# Patient Record
Sex: Male | Born: 1960
Health system: Southern US, Community
[De-identification: ages and names within clinical notes are randomized; demographics above are authoritative.]

## PROBLEM LIST (undated history)

## (undated) DIAGNOSIS — K5090 Crohn's disease, unspecified, without complications: Secondary | ICD-10-CM

## (undated) HISTORY — PX: OTHER SURGICAL HISTORY: SHX169

---

## 2017-03-14 DIAGNOSIS — K5000 Crohn's disease of small intestine without complications: Secondary | ICD-10-CM | POA: Diagnosis not present

## 2017-06-29 DIAGNOSIS — L298 Other pruritus: Secondary | ICD-10-CM | POA: Diagnosis not present

## 2017-06-29 DIAGNOSIS — M5412 Radiculopathy, cervical region: Secondary | ICD-10-CM | POA: Diagnosis not present

## 2017-06-30 DIAGNOSIS — M542 Cervicalgia: Secondary | ICD-10-CM | POA: Diagnosis not present

## 2017-08-16 DIAGNOSIS — Z23 Encounter for immunization: Secondary | ICD-10-CM | POA: Diagnosis not present

## 2017-09-06 DIAGNOSIS — Z7689 Persons encountering health services in other specified circumstances: Secondary | ICD-10-CM | POA: Diagnosis not present

## 2017-09-06 DIAGNOSIS — D649 Anemia, unspecified: Secondary | ICD-10-CM | POA: Diagnosis not present

## 2017-09-06 DIAGNOSIS — Z0000 Encounter for general adult medical examination without abnormal findings: Secondary | ICD-10-CM | POA: Diagnosis not present

## 2018-03-22 DIAGNOSIS — K5000 Crohn's disease of small intestine without complications: Secondary | ICD-10-CM | POA: Diagnosis not present

## 2018-06-15 DIAGNOSIS — Z23 Encounter for immunization: Secondary | ICD-10-CM | POA: Diagnosis not present

## 2018-06-15 DIAGNOSIS — M5412 Radiculopathy, cervical region: Secondary | ICD-10-CM | POA: Diagnosis not present

## 2018-06-15 DIAGNOSIS — S40862A Insect bite (nonvenomous) of left upper arm, initial encounter: Secondary | ICD-10-CM | POA: Diagnosis not present

## 2018-06-15 DIAGNOSIS — L089 Local infection of the skin and subcutaneous tissue, unspecified: Secondary | ICD-10-CM | POA: Diagnosis not present

## 2018-06-27 ENCOUNTER — Other Ambulatory Visit: Payer: Self-pay

## 2018-06-27 ENCOUNTER — Emergency Department: Payer: 59

## 2018-06-27 ENCOUNTER — Encounter: Payer: Self-pay | Admitting: Emergency Medicine

## 2018-06-27 ENCOUNTER — Inpatient Hospital Stay
Admission: EM | Admit: 2018-06-27 | Discharge: 2018-06-29 | DRG: 281 | Disposition: A | Discharge status: Home / Self Care | Payer: 59 | Attending: Internal Medicine | Admitting: Internal Medicine

## 2018-06-27 ENCOUNTER — Inpatient Hospital Stay (HOSPITAL_COMMUNITY)
Admit: 2018-06-27 | Discharge: 2018-06-27 | Disposition: A | Discharge status: Home / Self Care | Payer: 59 | Attending: Internal Medicine | Admitting: Internal Medicine

## 2018-06-27 DIAGNOSIS — I214 Non-ST elevation (NSTEMI) myocardial infarction: Secondary | ICD-10-CM | POA: Diagnosis not present

## 2018-06-27 DIAGNOSIS — N179 Acute kidney failure, unspecified: Secondary | ICD-10-CM | POA: Diagnosis present

## 2018-06-27 DIAGNOSIS — I248 Other forms of acute ischemic heart disease: Secondary | ICD-10-CM

## 2018-06-27 DIAGNOSIS — R5381 Other malaise: Secondary | ICD-10-CM | POA: Diagnosis not present

## 2018-06-27 DIAGNOSIS — Z9049 Acquired absence of other specified parts of digestive tract: Secondary | ICD-10-CM | POA: Diagnosis not present

## 2018-06-27 DIAGNOSIS — D649 Anemia, unspecified: Secondary | ICD-10-CM | POA: Diagnosis present

## 2018-06-27 DIAGNOSIS — R740 Nonspecific elevation of levels of transaminase and lactic acid dehydrogenase [LDH]: Secondary | ICD-10-CM | POA: Diagnosis present

## 2018-06-27 DIAGNOSIS — E860 Dehydration: Secondary | ICD-10-CM | POA: Diagnosis present

## 2018-06-27 DIAGNOSIS — D696 Thrombocytopenia, unspecified: Secondary | ICD-10-CM | POA: Diagnosis present

## 2018-06-27 DIAGNOSIS — F17210 Nicotine dependence, cigarettes, uncomplicated: Secondary | ICD-10-CM | POA: Diagnosis present

## 2018-06-27 DIAGNOSIS — B349 Viral infection, unspecified: Secondary | ICD-10-CM | POA: Diagnosis not present

## 2018-06-27 DIAGNOSIS — R0602 Shortness of breath: Secondary | ICD-10-CM | POA: Diagnosis not present

## 2018-06-27 DIAGNOSIS — Z79899 Other long term (current) drug therapy: Secondary | ICD-10-CM | POA: Diagnosis not present

## 2018-06-27 DIAGNOSIS — F419 Anxiety disorder, unspecified: Secondary | ICD-10-CM | POA: Diagnosis present

## 2018-06-27 DIAGNOSIS — I429 Cardiomyopathy, unspecified: Secondary | ICD-10-CM | POA: Diagnosis present

## 2018-06-27 DIAGNOSIS — I340 Nonrheumatic mitral (valve) insufficiency: Secondary | ICD-10-CM

## 2018-06-27 DIAGNOSIS — E876 Hypokalemia: Secondary | ICD-10-CM | POA: Diagnosis present

## 2018-06-27 DIAGNOSIS — D72829 Elevated white blood cell count, unspecified: Secondary | ICD-10-CM | POA: Diagnosis present

## 2018-06-27 DIAGNOSIS — E871 Hypo-osmolality and hyponatremia: Secondary | ICD-10-CM

## 2018-06-27 DIAGNOSIS — R748 Abnormal levels of other serum enzymes: Secondary | ICD-10-CM | POA: Diagnosis not present

## 2018-06-27 DIAGNOSIS — R7989 Other specified abnormal findings of blood chemistry: Secondary | ICD-10-CM | POA: Diagnosis present

## 2018-06-27 DIAGNOSIS — R778 Other specified abnormalities of plasma proteins: Secondary | ICD-10-CM | POA: Diagnosis present

## 2018-06-27 DIAGNOSIS — I420 Dilated cardiomyopathy: Secondary | ICD-10-CM | POA: Diagnosis not present

## 2018-06-27 HISTORY — DX: Crohn's disease, unspecified, without complications: K50.90

## 2018-06-27 LAB — URINALYSIS, COMPLETE (UACMP) WITH MICROSCOPIC
Bilirubin Urine: NEGATIVE
GLUCOSE, UA: NEGATIVE mg/dL
Ketones, ur: 5 mg/dL — AB
Nitrite: NEGATIVE
PROTEIN: 30 mg/dL — AB
SQUAMOUS EPITHELIAL / LPF: NONE SEEN (ref 0–5)
Specific Gravity, Urine: 1.023 (ref 1.005–1.030)
WBC, UA: >50 WBC/hpf — ABNORMAL HIGH (ref 0–5)
pH: 5 (ref 5.0–8.0)

## 2018-06-27 LAB — LIPID PANEL
Cholesterol: 104 mg/dL (ref 0–200)
HDL: <10 mg/dL — ABNORMAL LOW
Triglycerides: 193 mg/dL — ABNORMAL HIGH
VLDL: 39 mg/dL (ref 0–40)

## 2018-06-27 LAB — COMPREHENSIVE METABOLIC PANEL
ALBUMIN: 3.1 g/dL — AB (ref 3.5–5.0)
ALT: 115 U/L — ABNORMAL HIGH (ref 0–44)
AST: 158 U/L — AB (ref 15–41)
Alkaline Phosphatase: 124 U/L (ref 38–126)
Anion gap: 14 (ref 5–15)
BUN: 48 mg/dL — AB (ref 6–20)
CHLORIDE: 96 mmol/L — AB (ref 98–111)
CO2: 21 mmol/L — AB (ref 22–32)
CREATININE: 3.35 mg/dL — AB (ref 0.61–1.24)
Calcium: 8.5 mg/dL — ABNORMAL LOW (ref 8.9–10.3)
GFR calc Af Amer: 22 mL/min — ABNORMAL LOW (ref >60)
GFR calc non Af Amer: 19 mL/min — ABNORMAL LOW (ref >60)
GLUCOSE: 126 mg/dL — AB (ref 70–99)
Potassium: 3.5 mmol/L (ref 3.5–5.1)
SODIUM: 131 mmol/L — AB (ref 135–145)
Total Bilirubin: 1.5 mg/dL — ABNORMAL HIGH (ref 0.3–1.2)
Total Protein: 7.4 g/dL (ref 6.5–8.1)

## 2018-06-27 LAB — TROPONIN I
Troponin I: 3.9 ng/mL (ref <0.03)
Troponin I: 2.84 ng/mL
Troponin I: 2.58 ng/mL

## 2018-06-27 LAB — CBC
HCT: 43 % (ref 40.0–52.0)
Hemoglobin: 14.7 g/dL (ref 13.0–18.0)
MCH: 30.3 pg (ref 26.0–34.0)
MCHC: 34.2 g/dL (ref 32.0–36.0)
MCV: 88.8 fL (ref 80.0–100.0)
PLATELETS: 155 10*3/uL (ref 150–440)
RBC: 4.84 MIL/uL (ref 4.40–5.90)
RDW: 14.6 % — AB (ref 11.5–14.5)
WBC: 16.8 10*3/uL — AB (ref 3.8–10.6)

## 2018-06-27 LAB — HEMOGLOBIN A1C
Hgb A1c MFr Bld: 5.5 % (ref 4.8–5.6)
MEAN PLASMA GLUCOSE: 111.15 mg/dL

## 2018-06-27 LAB — PROTIME-INR
INR: 1.16
PROTHROMBIN TIME: 14.7 s (ref 11.4–15.2)

## 2018-06-27 LAB — GLUCOSE, CAPILLARY: Glucose-Capillary: 129 mg/dL — ABNORMAL HIGH (ref 70–99)

## 2018-06-27 LAB — HEPARIN LEVEL (UNFRACTIONATED): Heparin Unfractionated: 0.24 IU/mL — ABNORMAL LOW (ref 0.30–0.70)

## 2018-06-27 LAB — APTT: aPTT: 31 seconds (ref 24–36)

## 2018-06-27 MED ORDER — ACETAMINOPHEN 650 MG RE SUPP: 650.0000 mg | Freq: Four times a day (QID) | RECTAL | Status: DC | PRN

## 2018-06-27 MED ORDER — OXYCODONE HCL 5 MG PO TABS: 5.0000 mg | ORAL_TABLET | ORAL | Status: DC | PRN

## 2018-06-27 MED ORDER — HEPARIN BOLUS VIA INFUSION
4000.0000 [IU] | Freq: Once | INTRAVENOUS | Status: AC
  Administered 2018-06-27: 4000 [IU] via INTRAVENOUS
  Filled 2018-06-27: qty 4000

## 2018-06-27 MED ORDER — ONDANSETRON HCL 4 MG/2ML IJ SOLN: 4.0000 mg | Freq: Four times a day (QID) | INTRAMUSCULAR | Status: DC | PRN

## 2018-06-27 MED ORDER — ASPIRIN 81 MG PO CHEW
81.0000 mg | CHEWABLE_TABLET | Freq: Every day | ORAL | Status: DC
  Administered 2018-06-27 – 2018-06-29 (×3): 81 mg via ORAL
  Filled 2018-06-27 (×3): qty 1

## 2018-06-27 MED ORDER — SENNOSIDES-DOCUSATE SODIUM 8.6-50 MG PO TABS: 1.0000 | ORAL_TABLET | Freq: Every evening | ORAL | Status: DC | PRN

## 2018-06-27 MED ORDER — SODIUM CHLORIDE 0.9 % IV SOLN
INTRAVENOUS | Status: DC
  Administered 2018-06-27 – 2018-06-29 (×5): via INTRAVENOUS

## 2018-06-27 MED ORDER — ONDANSETRON HCL 4 MG PO TABS: 4.0000 mg | ORAL_TABLET | Freq: Four times a day (QID) | ORAL | Status: DC | PRN

## 2018-06-27 MED ORDER — SODIUM CHLORIDE 0.9 % IV BOLUS
1000.0000 mL | Freq: Once | INTRAVENOUS | Status: AC
  Administered 2018-06-27: 1000 mL via INTRAVENOUS

## 2018-06-27 MED ORDER — HEPARIN (PORCINE) IN NACL 100-0.45 UNIT/ML-% IJ SOLN
1700.0000 [IU]/h | INTRAMUSCULAR | Status: DC
  Administered 2018-06-27: 1150 [IU]/h via INTRAVENOUS
  Administered 2018-06-27: 1000 [IU]/h via INTRAVENOUS
  Administered 2018-06-28: 1500 [IU]/h via INTRAVENOUS
  Administered 2018-06-28: 1300 [IU]/h via INTRAVENOUS
  Filled 2018-06-27 (×3): qty 250

## 2018-06-27 MED ORDER — HEPARIN BOLUS VIA INFUSION
1250.0000 [IU] | Freq: Once | INTRAVENOUS | Status: AC
  Administered 2018-06-27: 1250 [IU] via INTRAVENOUS
  Filled 2018-06-27: qty 1250

## 2018-06-27 MED ORDER — ACETAMINOPHEN 325 MG PO TABS
650.0000 mg | ORAL_TABLET | Freq: Four times a day (QID) | ORAL | Status: DC | PRN
  Administered 2018-06-27: 650 mg via ORAL
  Filled 2018-06-27: qty 2

## 2018-06-27 MED ORDER — ALPRAZOLAM 0.5 MG PO TABS
0.2500 mg | ORAL_TABLET | Freq: Three times a day (TID) | ORAL | Status: DC | PRN
  Administered 2018-06-27 – 2018-06-28 (×2): 0.25 mg via ORAL
  Filled 2018-06-27 (×2): qty 1

## 2018-06-27 NOTE — Progress Notes (Signed)
ANTICOAGULATION CONSULT NOTE - Initial Consult  Pharmacy Consult for Heparin Indication: chest pain/ACS  No Known Allergies  Patient Measurements: Height: 5' 10"  (177.8 cm) Weight: 177 lb 6.4 oz (80.5 kg) IBW/kg (Calculated) : 73 Heparin Dosing Weight: 83.9 kg  Vital Signs: Temp: 98.2 F (36.8 C) (08/28 1649) Temp Source: Oral (08/28 1649) BP: 105/78 (08/28 1649) Pulse Rate: 106 (08/28 1649)  Labs: Recent Labs    06/27/18 1038 06/27/18 1217 06/27/18 1358 06/27/18 1841  HGB 14.7  --   --   --   HCT 43.0  --   --   --   PLT 155  --   --   --   APTT  --  31  --   --   LABPROT  --  14.7  --   --   INR  --  1.16  --   --   HEPARINUNFRC  --   --   --  0.24*  CREATININE 3.35*  --   --   --   TROPONINI 3.90*  --  2.84* 2.58*    Estimated Creatinine Clearance: 25.4 mL/min (A) (by C-G formula based on SCr of 3.35 mg/dL (H)).   Medical History: Past Medical History:  Diagnosis Date  . Crohn disease ([place])     Medications:  Infusions:  . sodium chloride 125 mL/hr at 06/27/18 1439  . heparin 1,000 Units/hr (06/27/18 1239)    Assessment: 57 yo male with shortness of breath, weakness, dizziness of 3 days.  Elevated troponin of 3.9 consistent with NSTEMI.  HEPARIN COURSE Give 4000 units bolus x 1 Start heparin infusion at 1000 units/hr Check anti-Xa level in 6 hours and daily while on heparin Continue to monitor H&H and platelets  Goal of Therapy:  Heparin level 0.3-0.7 units/ml Monitor platelets by anticoagulation protocol: Yes   Plan:  06/27/18 18:41 HL subtherapeutic x 1. 1250 units IV x 1 bolus and increase rate to 1150 units/hr. Will recheck HL 6 hours after rate increase.   Amana Bouska A. [place], [place].D., BCPS Clinical Pharmacist 06/27/2018,8:08 PM

## 2018-06-27 NOTE — ED Provider Notes (Signed)
[facility] Emergency Department Provider Note  Time seen: 10:26 AM  I have reviewed the triage vital signs and the nursing notes.   HISTORY  Chief Complaint Shortness of Breath; Weakness; Dizziness; Hip Pain; and Fall    HPI Justin Fernandez is a 57 y.o. male with a remote past medical history of Crohn's disease status post partial bowel resection, presents to the emergency department with complaints of nausea vomiting, diarrhea and chills and generalized fatigue/weakness.  According to the patient proximal he 5 days ago he started with nausea vomiting and diarrhea states those symptoms have largely resolved but he has been having chills over the past few days and will occasionally become sweaty.  States he is feeling generalized fatigue and weakness.  Denies any known fever.  Patient is feeling more dizzy today.  Patient did fall on his right hip 2 days ago while trying to get to the bathroom to vomit.  States mild pain in the right hip.   History reviewed. No pertinent past medical history.  There are no active problems to display for this patient.   History reviewed. No pertinent surgical history.  Prior to Admission medications   Not on File    Not on File  No family history on file.  Social History Social History   Tobacco Use  . Smoking status: Not on file  Substance Use Topics  . Alcohol use: Not on file  . Drug use: Not on file   Review of Systems Constitutional: Negative for fever.  Positive for dizziness and generalized weakness. Eyes: Negative for visual complaints ENT: Negative for recent illness/congestion Cardiovascular: Negative for chest pain. Respiratory: Negative for shortness of breath. Gastrointestinal: Negative for abdominal pain.  Positive for nausea vomiting and diarrhea which has since resolved Genitourinary: Negative for urinary compaints Musculoskeletal: Negative for musculoskeletal complaints Skin: Negative for skin  complaints  Neurological: Negative for headache All other ROS negative  ____________________________________________   PHYSICAL EXAM:  VITAL SIGNS: ED Triage Vitals  Enc Vitals Group     BP 06/27/18 0949 (!) 144/128     Pulse Rate 06/27/18 0949 (!) 117     Resp 06/27/18 0949 20     Temp 06/27/18 0950 98 F (36.7 C)     Temp Source 06/27/18 0950 Oral     SpO2 06/27/18 0949 97 %     Weight 06/27/18 0949 185 lb (83.9 kg)     Height 06/27/18 0949 5' 10"  (1.778 m)     Head Circumference --      Peak Flow --      Pain Score 06/27/18 0948 5     Pain Loc --      Pain Edu? --      Excl. in [place]? --     Constitutional: Alert and oriented. Well appearing and in no distress. Eyes: Normal exam ENT   Head: Normocephalic and atraumatic.   Mouth/Throat: Mucous membranes are moist. Cardiovascular: Normal rate, regular rhythm. No murmur Respiratory: Normal respiratory effort without tachypnea nor retractions. Breath sounds are clear Gastrointestinal: Soft and nontender. No distention.   Musculoskeletal: Mild right hip tenderness to palpation great range of motion, no concern for fracture dislocation. Neurologic:  Normal speech and language. No gross focal neurologic deficits Skin:  Skin is warm, dry and intact.  Psychiatric: Mood and affect are normal.  ____________________________________________    EKG  EKG reviewed and interpreted by myself shows sinus tachycardia 117 bpm with a narrow QRS, normal axis, normal intervals,  nonspecific ST changes without ST elevation.  ____________________________________________    RADIOLOGY  Chest x-ray negative  ____________________________________________   INITIAL IMPRESSION / ASSESSMENT AND PLAN / ED COURSE  Pertinent labs & imaging results that were available during my care of the patient were reviewed by me and considered in my medical decision making (see chart for details).  Patient presents to the emergency department for  dizziness and weakness, states to 3 days ago he was having nausea vomiting diarrhea which has since resolved.  Differential would include gastroenteritis, renal insufficiency, infectious etiology such as urinary tract infection or pneumonia.  We will check labs, chest x-ray, IV hydrate and closely monitor in the emergency department.  Patient's labs have resulted very abnormal with acute renal failure elevated LFTs and a troponin of 3.9 consistent with NSTEMI possible shock liver.  Patient is somewhat hypotensive receiving IV fluids.  We will start on a heparin infusion and admit to the hospitalist service.  CRITICAL CARE Performed by: Harvest Dark   Total critical care time: 30 minutes  Critical care time was exclusive of separately billable procedures and treating other patients.  Critical care was necessary to treat or prevent imminent or life-threatening deterioration.  Critical care was time spent personally by me on the following activities: development of treatment plan with patient and/or surrogate as well as nursing, discussions with consultants, evaluation of patient's response to treatment, examination of patient, obtaining history from patient or surrogate, ordering and performing treatments and interventions, ordering and review of laboratory studies, ordering and review of radiographic studies, pulse oximetry and re-evaluation of patient's condition.  ____________________________________________   FINAL CLINICAL IMPRESSION(S) / ED DIAGNOSES  Weakness NSTEMI    Harvest Dark, MD 06/27/18 1136

## 2018-06-27 NOTE — Plan of Care (Signed)
  Problem: Clinical Measurements: Goal: Ability to maintain clinical measurements within normal limits will improve Outcome: Progressing Note:  Troponin values trending down from 3.9 to 2.8. Will continue to monitor lab values. Wenda Low [facility]

## 2018-06-27 NOTE — ED Triage Notes (Signed)
Pt reports was dizzy and fell Monday hurting his right hip. Pt reports has had flu like sx's for a week. Pt reports feels dizzy, weak, like he can't breathe and not well. Pt pale in triage.

## 2018-06-27 NOTE — H&P (Signed)
[facility] NAME: Justin Fernandez    MR#:  [id number]  DATE OF BIRTH:  April 13, 1961  DATE OF ADMISSION:  06/27/2018  PRIMARY CARE PHYSICIAN: Dr Kary Kos   REQUESTING/REFERRING PHYSICIAN: Dr Kerman Passey  CHIEF COMPLAINT:   weakness HISTORY OF PRESENT ILLNESS:  Justin Fernandez  is a 57 y.o. male with a known history of Crohn's disease status post partial bowel resection over 30 years ago and tobacco dependence who presents to the emergency room due to generalized weakness.  Patient reports over the past several days he has had nausea, vomiting and diarrhea however the symptoms have improved.  He has been very weak and so comes to the ER for further evaluation.  In the emergency room he is noted to have acute kidney injury and elevated troponin.  Patient denies chest pain, shortness of breath, fever or chills.  Patient denies any urinary symptoms however he has had decreased urine output over the past 24 hours.  Patient denies abdominal pain and has mentioned above nausea, vomiting and diarrhea have improved.  Patient does report feelings of dizziness with lightheadedness however denies syncope.  PAST MEDICAL HISTORY:  Crohn's  disease  PAST SURGICAL HISTORY:  Partial bowel resection for Crohn's  SOCIAL HISTORY:   Patient smokes 1 pack a day and drinks occasional EtOH  FAMILY HISTORY:  No history of Crohn's or CAD  DRUG ALLERGIES:  No Known Allergies  REVIEW OF SYSTEMS:   Review of Systems  Constitutional: Positive for malaise/fatigue. Negative for chills and fever.  HENT: Negative.  Negative for ear discharge, ear pain, hearing loss, nosebleeds and sore throat.   Eyes: Negative.  Negative for blurred vision and pain.  Respiratory: Negative.  Negative for cough, hemoptysis, shortness of breath and wheezing.   Cardiovascular: Negative.  Negative for chest pain, palpitations and leg swelling.  Gastrointestinal: Negative.  Negative for  abdominal pain, blood in stool, diarrhea, nausea and vomiting.  Genitourinary: Negative for dysuria.       Decreased urine output  Musculoskeletal: Negative.  Negative for back pain.  Skin: Negative.   Neurological: Negative for dizziness, tremors, speech change, focal weakness, seizures and headaches.  Endo/Heme/Allergies: Negative.  Does not bruise/bleed easily.  Psychiatric/Behavioral: Negative.  Negative for depression, hallucinations and suicidal ideas.    MEDICATIONS AT HOME:   Prior to Admission medications   Medication Sig Start Date End Date Taking? Authorizing Provider  ALPRAZolam Duanne Moron) 0.5 MG tablet Take 0.5 mg by mouth 3 (three) times daily. 06/14/18  Yes [provider]  cyanocobalamin (,VITAMIN B-12,) 1000 MCG/ML injection Inject 1,000 mcg into the skin every 30 (thirty) days. 06/05/18  Yes [provider]  Multiple Vitamin (MULTI-VITAMINS) TABS Take 1 tablet by mouth daily.   Yes [provider]  vitamin C (ASCORBIC ACID) 500 MG tablet Take 500 mg by mouth daily.   Yes [provider]      VITAL SIGNS:  Blood pressure 92/75, pulse (!) 102, temperature 98 F (36.7 C), temperature source Oral, resp. rate 16, height 5' 10"  (1.778 m), weight 83.9 kg, SpO2 100 %.  PHYSICAL EXAMINATION:   Physical Exam  Constitutional: He is oriented to person, place, and time. No distress.  HENT:  Head: Normocephalic.  Eyes: No scleral icterus.  Neck: Normal range of motion. Neck supple. No JVD present. No tracheal deviation present.  Cardiovascular: Regular rhythm and normal heart sounds. Exam reveals no gallop and no friction rub.  No murmur heard. Tachycardic  Pulmonary/Chest: Effort normal and breath sounds normal. No respiratory distress. He has no wheezes. He has no rales. He exhibits no tenderness.  Abdominal: Soft. Bowel sounds are normal. He exhibits no distension and no mass. There is no tenderness. There is no rebound and no guarding.   Musculoskeletal: Normal range of motion. He exhibits no edema.  Neurological: He is alert and oriented to person, place, and time.  Skin: Skin is warm. No rash noted. No erythema.  Psychiatric: Judgment normal.      LABORATORY PANEL:   CBC Recent Labs  Lab 06/27/18 1038  WBC 16.8*  HGB 14.7  HCT 43.0  PLT 155   ------------------------------------------------------------------------------------------------------------------  Chemistries  Recent Labs  Lab 06/27/18 1038  NA 131*  K 3.5  CL 96*  CO2 21*  GLUCOSE 126*  BUN 48*  CREATININE 3.35*  CALCIUM 8.5*  AST 158*  ALT 115*  ALKPHOS 124  BILITOT 1.5*   ------------------------------------------------------------------------------------------------------------------  Cardiac Enzymes Recent Labs  Lab 06/27/18 1038  TROPONINI 3.90*   ------------------------------------------------------------------------------------------------------------------  RADIOLOGY:  Dg Chest 2 View  Result Date: 06/27/2018 CLINICAL DATA:  Shortness of breath. EXAM: CHEST - 2 VIEW COMPARISON:  None. FINDINGS: The heart size and mediastinal contours are within normal limits. Both lungs are clear. No pneumothorax or pleural effusion is noted. The visualized skeletal structures are unremarkable. IMPRESSION: No active cardiopulmonary disease. Electronically Signed   By: Marijo Conception, M.D.   On: 06/27/2018 10:34    EKG:  Sinus tachycardia no ST elevation or depression  IMPRESSION AND PLAN:   57 year old male with tobacco dependence and remote history of Crohn's disease who presents to the emergency room after 4 to 5 days of nausea, vomiting and diarrhea which have subsided however has generalized weakness.  1.  Elevated troponin of 3.90: This is concerning for non-ST elevation MI versus demand ischemia with poor renal clearance Continue heparin drip as started by ED physician.  Side effects, alternatives, risks and benefits were  discussed with patient who accepts these risks. No beta-blocker due to low blood pressure Start aspirin Check lipid panel and A1c Follow-up on echocardiogram C [facility] cardiology consultation requested Further management after cardiology evaluation  2.  Acute kidney injury due to nausea, vomiting and diarrhea with decreased urine output Continue aggressive hydration Avoid nephrotoxic medications Consider renal ultrasound and renal consultation if creatinine has not improved in a.m.  3.  Hyponatremia from poor p.o. intake and dehydration which should improve with IV fluids  4.Tobacco dependence: Patient is encouraged to quit smoking. Counseling was provided for 4 minutes. He does not want a nicotine patch at this time but reports he will speak with nurse if he needs one later.     All the records are reviewed and case discussed with ED provider. Management plans discussed with the patient and he is in agreement  CODE STATUS: Full  TOTAL TIME TAKING CARE OF THIS PATIENT: 48 minutes.    Austina Constantin M.D on 06/27/2018 at 12:02 PM  Between 7am to 6pm - Pager - [phone]  After 6pm go to [email or web address] - password [email or web address] [facility]  Office  [phone]  CC: Primary care physician; [place] Pink, MD

## 2018-06-27 NOTE — ED Notes (Signed)
Pt's neighbor states pt has his phone number if he needs anything, "he has no one else."

## 2018-06-27 NOTE — Progress Notes (Signed)
Family Meeting Note  Advance Directive:yes  Today a meeting took place with the Patient.  The following clinical team members were present during this meeting:MD  The following were discussed:Patient's diagnosis elevated troponin/non-ST elevation MI Acute kidney injury Hyponatremia: , Patient's progosis: > 12 months and Goals for treatment: Full Code  Additional follow-up to be provided: Patient is full code.  His healthcare power of attorney is a friend Hinda Lenis.  His advanced directives are up-to-date.  Time spent during discussion: 16 minutes  Dale Ribeiro, MD

## 2018-06-27 NOTE — ED Notes (Signed)
Report to [facility]

## 2018-06-27 NOTE — Progress Notes (Signed)
*  PRELIMINARY RESULTS* Echocardiogram 2D Echocardiogram has been performed.  Justin Fernandez Elke Holtry 06/27/2018, 8:54 PM

## 2018-06-27 NOTE — Progress Notes (Signed)
ANTICOAGULATION CONSULT NOTE - Initial Consult  Pharmacy Consult for Heparin Indication: chest pain/ACS  No Known Allergies  Patient Measurements: Height: 5' 10"  (177.8 cm) Weight: 185 lb (83.9 kg) IBW/kg (Calculated) : 73 Heparin Dosing Weight: 83.9 kg  Vital Signs: Temp: 98 F (36.7 C) (08/28 0950) Temp Source: Oral (08/28 0950) BP: 92/75 (08/28 1100) Pulse Rate: 102 (08/28 1100)  Labs: Recent Labs    06/27/18 1038  HGB 14.7  HCT 43.0  PLT 155  CREATININE 3.35*  TROPONINI 3.90*    Estimated Creatinine Clearance: 25.4 mL/min (A) (by C-G formula based on SCr of 3.35 mg/dL (H)).   Medical History: History reviewed. No pertinent past medical history.  Medications:  Infusions:  . sodium chloride      Assessment: 57 yo male with shortness of breath, weakness, dizziness of 3 days.  Elevated troponin of 3.9 consistent with NSTEMI.  Goal of Therapy:  Heparin level 0.3-0.7 units/ml Monitor platelets by anticoagulation protocol: Yes   Plan:  Give 4000 units bolus x 1 Start heparin infusion at 1000 units/hr Check anti-Xa level in 6 hours and daily while on heparin Continue to monitor H&H and platelets  Prudy Feeler, RPh 06/27/2018,12:02 PM

## 2018-06-27 NOTE — Consult Note (Signed)
Cardiology Consultation:   Patient ID: Justin Fernandez; [id number]; November 12, 1960   Admit date: 06/27/2018 Date of Consult: 06/27/2018  Primary Care Provider: Maryland Pink, MD Primary Cardiologist: new to [facility] - consult by [place]   Patient Profile:   Justin Fernandez is a 57 y.o. male with a hx of Crohn's disease s/p partial bowel resection in the remote past and tobacco abuse who is being seen today for the evaluation of elevated troponin at the request of Dr. Benjie Karvonen.  History of Present Illness:   Mr. Fandrich does not have any previously known cardiac history. 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 He recently underwent bone-grafting and dental extraction about 1 month prior. He has been in his usual state of health up until the week prior when he developed severe lethargy sleeping intermittently for 20 hours per day with frequent urination every 45 minutes followed by nausea, vomiting, and diarrhea with poor PO intake. He has not been able to eat anything or drink anything other than some electrolyte drink he had from [place]. In this setting, he suffered an mechanical fall in his bathroom secondary to weakness. He initially thought he had the flu, though his symptoms have continued to worsen. He has bene afebrile and without chills. No myalgias or rashes. Never with chest pain, SOB, or diaphoresis.   Upon the patient's arrival to [facility] they were found to have BP in the 197J systolic that has trended to the 88T to 25Q systolic, HR 982 bpm, temp 98, oxygen saturation 97% on room air, weight 83/9 kg. EKG as below, CXR showed no active cardiopulmonary disease. Labs showed troponin 3.90, SCr 3.35 (prior SCr 1.0 from 08/2017), BUN 48, Na 131, K+ 3.5, glucose 126, albumin 3.1, AST 158, ALT 115, T bili 1.5, WBC 16.8, HGB 14.7, PLT 155. In the ED and upon admission the patient was given IV fluids and started on a heparin gtt. Currently, continues to feel weak.    Past  Medical History:  Diagnosis Date  . Crohn disease [facility])     Past Surgical History:  Procedure Laterality Date  . bowel resection       Home Meds: Prior to Admission medications   Medication Sig Start Date End Date Taking? Authorizing Provider  ALPRAZolam Duanne Moron) 0.5 MG tablet Take 0.5 mg by mouth 3 (three) times daily. 06/14/18  Yes [provider]  cyanocobalamin (,VITAMIN B-12,) 1000 MCG/ML injection Inject 1,000 mcg into the skin every 30 (thirty) days. 06/05/18  Yes [provider]  loperamide (IMODIUM) 2 MG capsule Take 2-4 mg by mouth daily.   Yes [provider]  Multiple Vitamin (MULTI-VITAMINS) TABS Take 1 tablet by mouth daily.   Yes [provider]  vitamin C (ASCORBIC ACID) 500 MG tablet Take 500 mg by mouth daily.   Yes [provider]    Inpatient Medications: Scheduled Meds: . aspirin  81 mg Oral Daily   Continuous Infusions: . sodium chloride 125 mL/hr at 06/27/18 1439  . heparin 1,000 Units/hr (06/27/18 1239)   PRN Meds: acetaminophen **OR** acetaminophen, ALPRAZolam, ondansetron **OR** ondansetron (ZOFRAN) IV, oxyCODONE, senna-docusate  Allergies:  No Known Allergies  Social History:   Social History   Socioeconomic History  . Marital status: Divorced    Spouse name: Not on file  . Number of children: Not on file  . Years of education: Not on file  . Highest education level: Not on file  Occupational  History  . Not on file  Social Needs  . Financial resource strain: Not on file  . Food insecurity:    Worry: Not on file    Inability: Not on file  . Transportation needs:    Medical: Not on file    Non-medical: Not on file  Tobacco Use  . Smoking status: Not on file  Substance and Sexual Activity  . Alcohol use: Not on file  . Drug use: Not on file  . Sexual activity: Not on file  Lifestyle  . Physical activity:    Days per week: Not on file    Minutes per session: Not on file  . Stress: Not on file   Relationships  . Social connections:    Talks on phone: Not on file    Gets together: Not on file    Attends religious service: Not on file    Active member of club or organization: Not on file    Attends meetings of clubs or organizations: Not on file    Relationship status: Not on file  . Intimate partner violence:    Fear of current or ex partner: Not on file    Emotionally abused: Not on file    Physically abused: Not on file    Forced sexual activity: Not on file  Other Topics Concern  . Not on file  Social History Narrative  . Not on file     Family History:   Family History  Problem Relation Age of Onset  . Breast cancer Mother   . Colon cancer Father   . Stroke Maternal Grandmother   . Stroke Maternal Grandfather     ROS:  Review of Systems  Constitutional: Positive for malaise/fatigue and weight loss. Negative for chills, diaphoresis and fever.  HENT: Negative for congestion.   Eyes: Negative for discharge and redness.  Respiratory: Negative for cough, hemoptysis, sputum production, shortness of breath and wheezing.   Cardiovascular: Negative for chest pain, palpitations, orthopnea, claudication, leg swelling and PND.  Gastrointestinal: Positive for abdominal pain, diarrhea, nausea and vomiting. Negative for blood in stool, constipation, heartburn and melena.  Genitourinary: Negative for hematuria.  Musculoskeletal: Positive for falls. Negative for myalgias.  Skin: Negative for rash.  Neurological: Positive for weakness. Negative for dizziness, tingling, tremors, sensory change, speech change, focal weakness and loss of consciousness.  Endo/Heme/Allergies: Does not bruise/bleed easily.  Psychiatric/Behavioral: Negative for substance abuse. The patient is not nervous/anxious.   All other systems reviewed and are negative.     Physical Exam/Data:   Vitals:   06/27/18 1036 06/27/18 1100 06/27/18 1300 06/27/18 1649  BP: 97/73 92/75 95/70  105/78  Pulse: (!) 104  (!) 102 (!) 101 (!) 106  Resp: 18 16 19    Temp:   97.9 F (36.6 C) 98.2 F (36.8 C)  TempSrc:   Oral Oral  SpO2: 96% 100% 97% 96%  Weight:      Height:        Intake/Output Summary (Last 24 hours) at 06/27/2018 1708 Last data filed at 06/27/2018 1650 Gross per 24 hour  Intake -  Output 150 ml  Net -150 ml   Filed Weights   06/27/18 0949  Weight: 83.9 kg   Body mass index is 26.54 kg/m.   Physical Exam: General: Well developed, well nourished, in no acute distress. Head: Normocephalic, atraumatic, sclera non-icteric, no xanthomas, nares without discharge.  Neck: Negative for carotid bruits. JVD not elevated. Lungs: Clear bilaterally to auscultation without wheezes, rales, or  rhonchi. Breathing is unlabored. Heart: Tachycardic with S1 S2. No murmurs, rubs, or gallops appreciated. Abdomen: Soft, non-tender, non-distended with normoactive bowel sounds. No hepatomegaly. No rebound/guarding. No obvious abdominal masses. Msk:  Strength and tone appear normal for age. Extremities: No clubbing or cyanosis. No edema. Distal pedal pulses are 2+ and equal bilaterally. Neuro: Alert and oriented X 3. No facial asymmetry. No focal deficit. Moves all extremities spontaneously. Psych:  Responds to questions appropriately with a normal affect.   EKG:  The EKG was personally reviewed and demonstrates: sinus tachycardia, 117 bpm, poor R wave progression, nonspecific st/t changes  Telemetry:  Telemetry was personally reviewed and demonstrates: NSR  Weights: [employer]   06/27/18 0949  Weight: 83.9 kg    Relevant CV Studies: Echo pending  Laboratory Data:  Chemistry Recent Labs  Lab 06/27/18 1038  NA 131*  K 3.5  CL 96*  CO2 21*  GLUCOSE 126*  BUN 48*  CREATININE 3.35*  CALCIUM 8.5*  GFRNONAA 19*  GFRAA 22*  ANIONGAP 14    Recent Labs  Lab 06/27/18 1038  PROT 7.4  ALBUMIN 3.1*  AST 158*  ALT 115*  ALKPHOS 124  BILITOT 1.5*   Hematology Recent Labs  Lab  06/27/18 1038  WBC 16.8*  RBC 4.84  HGB 14.7  HCT 43.0  MCV 88.8  MCH 30.3  MCHC 34.2  RDW 14.6*  PLT 155   Cardiac Enzymes Recent Labs  Lab 06/27/18 1038 06/27/18 1358  TROPONINI 3.90* 2.84*   No results for input(s): TROPIPOC in the last 168 hours.  BNPNo results for input(s): BNP, PROBNP in the last 168 hours.  DDimer No results for input(s): DDIMER in the last 168 hours.  Radiology/Studies:  Dg Chest 2 View  Result Date: 06/27/2018 IMPRESSION: No active cardiopulmonary disease. Electronically Signed   By: Marijo Conception, M.D.   On: 06/27/2018 10:34    Assessment and Plan:   1. Elevated troponin: -Initial troponin is elevated at 3.9 -Subsequent troponin is down trending to 2.84 -Never with anginal symptoms  -Possibly in the setting of supply demand ischemia in the setting of the below -Continue heparin gtt until echo is read, if normal EF would stop heparin at that time -Not a current candidate for LHC given his AKI -He will need an ischemic evaluation, with the timing, and type of evaluation pending his renal function trend and echo results  -Check urine drug screen -Check echo -Check A1c and lipid panel for further risk stratification  -Consider PE evaluation, if IM feels indicated   2. ARF: -IV fluids per IM -Consider nephrology evaluation  -Likely contributing to the above  3. Transaminatitis: -Recommend further work up per IM -Recommend imaging per IM  4. Hyponatremia: -IV fluids per IM  5. Nausea/vomiting/diarrhea: -Per IM -Check magnesium   6. Leukocytosis: -Likely in the setting of the above -Cannot exclude unknown infection at this time -Recommend full work up with cultures per IM -His overall presentation is concerning for unknown process   For questions or updates, please contact [place] Please consult [email or web address] for contact info under Cardiology/STEMI.   Signed, Christell Faith, PA-C [place] Pager: [phone] 06/27/2018, 5:08 PM

## 2018-06-27 NOTE — ED Notes (Signed)
Pt reports sxs of weakness and dizziness since last week, pt states he fell on Monday night pt states he has had poor PO intake x a week.

## 2018-06-28 DIAGNOSIS — I420 Dilated cardiomyopathy: Secondary | ICD-10-CM

## 2018-06-28 DIAGNOSIS — R5381 Other malaise: Secondary | ICD-10-CM

## 2018-06-28 DIAGNOSIS — R748 Abnormal levels of other serum enzymes: Secondary | ICD-10-CM

## 2018-06-28 DIAGNOSIS — E876 Hypokalemia: Secondary | ICD-10-CM

## 2018-06-28 DIAGNOSIS — F419 Anxiety disorder, unspecified: Secondary | ICD-10-CM

## 2018-06-28 LAB — BASIC METABOLIC PANEL
Anion gap: 10 (ref 5–15)
BUN: 41 mg/dL — AB (ref 6–20)
CO2: 23 mmol/L (ref 22–32)
CREATININE: 1.89 mg/dL — AB (ref 0.61–1.24)
Calcium: 7.6 mg/dL — ABNORMAL LOW (ref 8.9–10.3)
Chloride: 103 mmol/L (ref 98–111)
GFR, EST AFRICAN AMERICAN: 44 mL/min — AB (ref >60)
GFR, EST NON AFRICAN AMERICAN: 38 mL/min — AB (ref >60)
Glucose, Bld: 119 mg/dL — ABNORMAL HIGH (ref 70–99)
Potassium: 2.9 mmol/L — ABNORMAL LOW (ref 3.5–5.1)
SODIUM: 136 mmol/L (ref 135–145)

## 2018-06-28 LAB — HIV ANTIBODY (ROUTINE TESTING W REFLEX): HIV Screen 4th Generation wRfx: NONREACTIVE

## 2018-06-28 LAB — ECHOCARDIOGRAM COMPLETE
Height: 70 in
Weight: 2838.4 oz

## 2018-06-28 LAB — HEPARIN LEVEL (UNFRACTIONATED)
HEPARIN UNFRACTIONATED: 0.24 [IU]/mL — AB (ref 0.30–0.70)
HEPARIN UNFRACTIONATED: 0.23 [IU]/mL — AB (ref 0.30–0.70)
Heparin Unfractionated: 0.3 IU/mL (ref 0.30–0.70)

## 2018-06-28 LAB — CBC
HCT: 38 % — ABNORMAL LOW (ref 40.0–52.0)
Hemoglobin: 12.9 g/dL — ABNORMAL LOW (ref 13.0–18.0)
MCH: 30.6 pg (ref 26.0–34.0)
MCHC: 34.1 g/dL (ref 32.0–36.0)
MCV: 89.9 fL (ref 80.0–100.0)
PLATELETS: 139 10*3/uL — AB (ref 150–440)
RBC: 4.22 MIL/uL — ABNORMAL LOW (ref 4.40–5.90)
RDW: 15 % — ABNORMAL HIGH (ref 11.5–14.5)
WBC: 11.9 10*3/uL — ABNORMAL HIGH (ref 3.8–10.6)

## 2018-06-28 LAB — POTASSIUM: Potassium: 3 mmol/L — ABNORMAL LOW (ref 3.5–5.1)

## 2018-06-28 LAB — TROPONIN I: TROPONIN I: 2.01 ng/mL — AB (ref <0.03)

## 2018-06-28 LAB — MAGNESIUM: Magnesium: 2.6 mg/dL — ABNORMAL HIGH (ref 1.7–2.4)

## 2018-06-28 MED ORDER — POTASSIUM CHLORIDE CRYS ER 20 MEQ PO TBCR
40.0000 meq | EXTENDED_RELEASE_TABLET | Freq: Once | ORAL | Status: AC
  Administered 2018-06-28: 40 meq via ORAL
  Filled 2018-06-28: qty 2

## 2018-06-28 MED ORDER — HEPARIN BOLUS VIA INFUSION
1250.0000 [IU] | Freq: Once | INTRAVENOUS | Status: AC
  Administered 2018-06-28: 1250 [IU] via INTRAVENOUS
  Filled 2018-06-28: qty 1250

## 2018-06-28 MED ORDER — POTASSIUM CHLORIDE CRYS ER 20 MEQ PO TBCR: 40.0000 meq | EXTENDED_RELEASE_TABLET | Freq: Once | ORAL | Status: DC

## 2018-06-28 MED ORDER — POTASSIUM CHLORIDE CRYS ER 20 MEQ PO TBCR
60.0000 meq | EXTENDED_RELEASE_TABLET | Freq: Once | ORAL | Status: AC
  Administered 2018-06-28: 60 meq via ORAL
  Filled 2018-06-28: qty 3

## 2018-06-28 MED ORDER — HEPARIN BOLUS VIA INFUSION
1300.0000 [IU] | Freq: Once | INTRAVENOUS | Status: AC
  Administered 2018-06-28: 1300 [IU] via INTRAVENOUS
  Filled 2018-06-28: qty 1300

## 2018-06-28 NOTE — Progress Notes (Signed)
ANTICOAGULATION CONSULT NOTE   Pharmacy Consult for Heparin Indication: chest pain/ACS  No Known Allergies  Patient Measurements: Height: 5' 10"  (177.8 cm) Weight: 187 lb 3.2 oz (84.9 kg) IBW/kg (Calculated) : 73 Heparin Dosing Weight: 83.9 kg  Vital Signs: Temp: 98.3 F (36.8 C) (08/29 1601) Temp Source: Oral (08/29 1601) BP: 95/70 (08/29 1601) Pulse Rate: 107 (08/29 1601)  Labs: Recent Labs    06/27/18 1038 06/27/18 1217 06/27/18 1358  06/27/18 1841 06/28/18 0122 06/28/18 0252 06/28/18 1002 06/28/18 1823  HGB 14.7  --   --   --   --   --  12.9*  --   --   HCT 43.0  --   --   --   --   --  38.0*  --   --   PLT 155  --   --   --   --   --  139*  --   --   APTT  --  31  --   --   --   --   --   --   --   LABPROT  --  14.7  --   --   --   --   --   --   --   INR  --  1.16  --   --   --   --   --   --   --   HEPARINUNFRC  --   --   --    < > 0.24*  --  0.24* 0.23* 0.30  CREATININE 3.35*  --   --   --   --   --  1.89*  --   --   TROPONINI 3.90*  --  2.84*  --  2.58* 2.01*  --   --   --    < > = values in this interval not displayed.    Estimated Creatinine Clearance: 45.1 mL/min (A) (by C-G formula based on SCr of 1.89 mg/dL (H)).   Medical History: Past Medical History:  Diagnosis Date  . Crohn disease ([place])     Medications:  Infusions:  . sodium chloride 125 mL/hr at 06/28/18 1819  . heparin 1,500 Units/hr (06/28/18 1138)    Assessment: 57 yo male with shortness of breath, weakness, dizziness of 3 days.  Elevated troponin of 3.9 consistent with NSTEMI.  HEPARIN COURSE 8/28  Start heparin infusion at 1000 units/hr 06/27/18 @ 18:41 Rate increased to 1150 units/hr  06/28/18 @ 0252 HL 0.24. Level is subtherapeutic. Will order 1250 unit bolus and increase infusion to 1300 units/hr.   Will recheck HL 6 hours after rate increase.  08/29 HL @ 1002=  0.23. Will order Bolus of 1300 units x1 and increase drip rate to 1500 units/hr. Recheck HL in 6 hours  Goal of  Therapy:  Heparin level 0.3-0.7 units/ml Monitor platelets by anticoagulation protocol: Yes   Plan:  06/28/18 18:23 HL therapeutic x 1. Continue current rate. Will recheck HL in 6 hours.  Laural Benes, PharmD, BCPS Clinical Pharmacist 06/28/2018 7:15 PM

## 2018-06-28 NOTE — Progress Notes (Signed)
Advanced care plan.  Purpose of the Encounter: CODE STATUS  Parties in Attendance: Patient  Patient's Decision Capacity: Good  Subjective/Patient's story: Presented to the emergency room for generalized weakness and nausea and vomiting   Objective/Medical story Has elevated troponin and needs cardiology work-up Patient also dehydrated and has renal failure Needs IV fluids   Goals of care determination:  Advance care directives, goals of care discussed with the patient Treatment plan discussed Patient wants everything done which includes CPR, intubation and ventilator if the need arises   CODE STATUS: Full code   Time spent discussing advanced care planning: 16 minutes

## 2018-06-28 NOTE — Progress Notes (Addendum)
Justin Fernandez at [place] NAME: Justin Fernandez    MR#:  [id number]  DATE OF BIRTH:  1960/12/16  SUBJECTIVE:  CHIEF COMPLAINT:   Chief Complaint  Patient presents with  . Shortness of Breath  . Weakness  . Dizziness  . Hip Pain  . Fall  Patient seen today No complaints of any chest pain Has some generalized weakness No fever  REVIEW OF SYSTEMS:    ROS  CONSTITUTIONAL: No documented fever. Has fatigue, weakness. No weight gain, no weight loss.  EYES: No blurry or double vision.  ENT: No tinnitus. No postnasal drip. No redness of the oropharynx.  RESPIRATORY: No cough, no wheeze, no hemoptysis. No dyspnea.  CARDIOVASCULAR: No chest pain. No orthopnea. No palpitations. No syncope.  GASTROINTESTINAL: No nausea, no vomiting or diarrhea. No abdominal pain. No melena or hematochezia.  GENITOURINARY: No dysuria or hematuria.  ENDOCRINE: No polyuria or nocturia. No heat or cold intolerance.  HEMATOLOGY: No anemia. No bruising. No bleeding.  INTEGUMENTARY: No rashes. No lesions.  MUSCULOSKELETAL: No arthritis. No swelling. No gout.  NEUROLOGIC: No numbness, tingling, or ataxia. No seizure-type activity.  PSYCHIATRIC: No anxiety. No insomnia. No ADD.   DRUG ALLERGIES:  No Known Allergies  VITALS:  Blood pressure 103/78, pulse (!) 107, temperature 98.7 F (37.1 C), temperature source Oral, resp. rate 20, height 5' 10"  (1.778 m), weight 84.9 kg, SpO2 98 %.  PHYSICAL EXAMINATION:   Physical Exam  GENERAL:  57 y.o.-year-old patient lying in the bed with no acute distress.  EYES: Pupils equal, round, reactive to light and accommodation. No scleral icterus. Extraocular muscles intact.  HEENT: Head atraumatic, normocephalic. Oropharynx and nasopharynx clear.  NECK:  Supple, no jugular venous distention. No thyroid enlargement, no tenderness.  LUNGS: Normal breath sounds bilaterally, no wheezing, rales, rhonchi. No use of accessory muscles of  respiration.  CARDIOVASCULAR: S1, S2 normal. No murmurs, rubs, or gallops.  ABDOMEN: Soft, nontender, nondistended. Bowel sounds present. No organomegaly or mass.  EXTREMITIES: No cyanosis, clubbing or edema b/l.    NEUROLOGIC: Cranial nerves II through XII are intact. No focal Motor or sensory deficits b/l.   PSYCHIATRIC: The patient is alert and oriented x 3.  SKIN: No obvious rash, lesion, or ulcer.   LABORATORY PANEL:   CBC Recent Labs  Lab 06/28/18 0252  WBC 11.9*  HGB 12.9*  HCT 38.0*  PLT 139*   ------------------------------------------------------------------------------------------------------------------ Chemistries  Recent Labs  Lab 06/27/18 1038 06/28/18 0252 06/28/18 1002  NA 131* 136  --   K 3.5 2.9* 3.0*  CL 96* 103  --   CO2 21* 23  --   GLUCOSE 126* 119*  --   BUN 48* 41*  --   CREATININE 3.35* 1.89*  --   CALCIUM 8.5* 7.6*  --   AST 158*  --   --   ALT 115*  --   --   ALKPHOS 124  --   --   BILITOT 1.5*  --   --    ------------------------------------------------------------------------------------------------------------------  Cardiac Enzymes Recent Labs  Lab 06/28/18 0122  TROPONINI 2.01*   ------------------------------------------------------------------------------------------------------------------  RADIOLOGY:  Dg Chest 2 View  Result Date: 06/27/2018 CLINICAL DATA:  Shortness of breath. EXAM: CHEST - 2 VIEW COMPARISON:  None. FINDINGS: The heart size and mediastinal contours are within normal limits. Both lungs are clear. No pneumothorax or pleural effusion is noted. The visualized skeletal structures are unremarkable. IMPRESSION: No active cardiopulmonary disease. Electronically Signed  By: Marijo Conception, M.D.   On: 06/27/2018 10:34     ASSESSMENT AND PLAN:   57 year old male patient with history of Crohn's disease, bowel resection in the past, tobacco abuse currently under hospitalist service for elevated troponin  -Elevated  troponin Versus non-STEMI On heparin drip Continue aspirin If renal function improves patient will get cardiac cath by cardiology tomorrow Appreciate cardiology evaluation  -Cardiomyopathy Echocardiogram reviewed Needs ischemic evaluation Review of low blood pressure currently off beta-blocker ACE inhibitor and ARB and Aldactone Resume meds when blood pressure tolerates  -Hypokalemia Replace potassium aggressively and recheck potassium level Check magnesium level  -Leukocytosis improving  -Acute renal failure Improving with IV fluids  -Abnormal liver function tests Follow-up LFTs  All the records are reviewed and case discussed with Care Management/Social Worker. Management plans discussed with the patient, family and they are in agreement.  CODE STATUS: Full code  DVT Prophylaxis: SCDs  TOTAL TIME TAKING CARE OF THIS PATIENT: 35  minutes.   POSSIBLE D/C IN 1 to 2 DAYS, DEPENDING ON CLINICAL CONDITION.  Saundra Shelling M.D on 06/28/2018 at 11:40 AM  Between 7am to 6pm - Pager - [phone]  After 6pm go to [email or web address] - password [email or web address] [facility]  Office  [phone]  CC: Primary care physician; Maryland Pink, MD  Note: This dictation was prepared with Dragon dictation along with smaller phrase technology. Any transcriptional errors that result from this process are unintentional.

## 2018-06-28 NOTE — Progress Notes (Addendum)
Progress Note  Patient Name: Justin Fernandez Date of Encounter: 06/28/2018  Primary Cardiologist: new to [facility] consult by Goll[place]  Subjective   No acute overnight events. No chest pain or SOB. Renal function has improved from 3.35-->1.89 this morning with IV fluids. Echo showed an EF of 40-45%, diffuse HK, G2DD, mild MR. Troponin has been down trending from his initial value of 3.9. BP remains soft precluding addition of evidence-based heart failure therapy. Potassium low.   Inpatient Medications    Scheduled Meds: . aspirin  81 mg Oral Daily  . potassium chloride  60 mEq Oral Once   Continuous Infusions: . sodium chloride 125 mL/hr at 06/27/18 2359  . heparin 1,300 Units/hr (06/28/18 0336)   PRN Meds: acetaminophen **OR** acetaminophen, ALPRAZolam, ondansetron **OR** ondansetron (ZOFRAN) IV, oxyCODONE, senna-docusate   Vital Signs    Vitals:   06/27/18 1649 06/27/18 2043 06/28/18 0513 06/28/18 0804  BP: 105/78 103/75 100/79 103/78  Pulse: (!) 106 (!) 112 (!) 108 (!) 107  Resp:  16 (!) 24 20  Temp: 98.2 F (36.8 C) 98.5 F (36.9 C) 98.9 F (37.2 C) 98.7 F (37.1 C)  TempSrc: Oral Oral Oral Oral  SpO2: 96% 96% 95% 98%  Weight:   84.9 kg   Height:        Intake/Output Summary (Last 24 hours) at 06/28/2018 0848 Last data filed at 06/28/2018 0524 Gross per 24 hour  Intake 843.36 ml  Output 700 ml  Net 143.36 ml   Filed Weights   06/27/18 0949 06/27/18 1300 06/28/18 0513  Weight: 83.9 kg 80.5 kg 84.9 kg    Telemetry    NSR to sinus tachycardia with heart rates in the 90s to 120s bpm - Personally Reviewed  ECG    n/a - Personally Reviewed  Physical Exam   GEN: No acute distress.   Neck: No JVD. Cardiac: RRR, no murmurs, rubs, or gallops.  Respiratory: Clear to auscultation bilaterally.  GI: Soft, nontender, non-distended.   MS: No edema; No deformity. Neuro:  Alert and oriented x 3; Nonfocal.  Psych: Normal affect.  Labs    Chemistry Recent Labs    Lab 06/27/18 1038 06/28/18 0252  NA 131* 136  K 3.5 2.9*  CL 96* 103  CO2 21* 23  GLUCOSE 126* 119*  BUN 48* 41*  CREATININE 3.35* 1.89*  CALCIUM 8.5* 7.6*  PROT 7.4  --   ALBUMIN 3.1*  --   AST 158*  --   ALT 115*  --   ALKPHOS 124  --   BILITOT 1.5*  --   GFRNONAA 19* 38*  GFRAA 22* 44*  ANIONGAP 14 10     Hematology Recent Labs  Lab 06/27/18 1038 06/28/18 0252  WBC 16.8* 11.9*  RBC 4.84 4.22*  HGB 14.7 12.9*  HCT 43.0 38.0*  MCV 88.8 89.9  MCH 30.3 30.6  MCHC 34.2 34.1  RDW 14.6* 15.0*  PLT 155 139*    Cardiac Enzymes Recent Labs  Lab 06/27/18 1038 06/27/18 1358 06/27/18 1841 06/28/18 0122  TROPONINI 3.90* 2.84* 2.58* 2.01*   No results for input(s): TROPIPOC in the last 168 hours.   BNPNo results for input(s): BNP, PROBNP in the last 168 hours.   DDimer No results for input(s): DDIMER in the last 168 hours.   Radiology    Dg Chest 2 View  Result Date: 06/27/2018 IMPRESSION: No active cardiopulmonary disease. Electronically Signed   By: Marijo Conception, M.D.   On: 06/27/2018 10:34  Cardiac Studies   2-D echo 06/27/2018: Study Conclusions  - Left ventricle: The cavity size was normal. There was moderate   concentric hypertrophy. Systolic function was mildly to   moderately reduced. The estimated ejection fraction was in the   range of 40% to 45%. Diffuse hypokinesis. Regional wall motion   abnormalities cannot be excluded. Features are consistent with a   pseudonormal left ventricular filling pattern, with concomitant   abnormal relaxation and increased filling pressure (grade 2   diastolic dysfunction). - Mitral valve: There was mild regurgitation. - Left atrium: The atrium was normal in size. - Right ventricle: Systolic function was normal. - Pulmonary arteries: Systolic pressure was within the normal   range.  Patient Profile     57 y.o. male with history of Crohn's disease s/p partial bowel resection in the remote past and  tobacco abuse who is being seen today for the evaluation of elevated troponin at the request of Dr. Benjie Karvonen.  Assessment & Plan    1. Elevated troponin vs NSTEMI in the setting of his acute illness: -Down trending with a peak of 3.9 -Heparin gtt -ASA -Given his cardiomyopathy noted on echo he would benefit from a cath to evaluate for ischemia, however his renal function is less than ideal for this, though it is improving -Will make NPO at midnight in case his renal function allows for cath on 8/30  2. Cardiomyopathy: -EF of 40-45% on echo this admission, no prior to compare -He will need an ischemic evaluation as above -Will need to be cautious not to over hydrate him given his cardiomyopathy  -Not currently on beta blocker/ACEi/ARB/spironolactone given hypotension with BP into the 62V systolic, currently in the low 035K systolic -Escalate evidence-based heart failure therapy as able  3. ARF: -Improving with IV hydration  4. Transaminitis: -No liver function today -Recommend IM trend  5.  Hyponatremia: -Improving  6. Hypokalemia: -Being repleted   7. Leukocytosis: -Improving -Per IM  8. Anemia/thrombocytopenia: -Likely dilutional    For questions or updates, please contact [place] Please consult [email or web address] for contact info under Cardiology/STEMI.    Signed, Christell Faith, PA-C [place] Pager: [phone] 06/28/2018, 8:48 AM   Attending Note Patient seen and examined, agree with detailed note above,  Patient presentation and plan discussed on rounds.   EKG lab work, chest x-ray, echocardiogram reviewed independently by myself  Did not sleep well, uncomfortable bed Still eating liquid diet fruit cup this morning Different indigestion last night  Lab work reviewed potassium markedly low 2.9 Troponin continues to trend down, 2.0 Creatinine improving 1.89 BUN 41  Still not at his baseline, general malaise  On exam sitting up in a recliner  unable to estimate JVD lungs with rales at the bases otherwise clear heart sounds regular no murmurs appreciated abdomen soft nontender no significant lower extremity edema  A/P: 1) elevated troponin Unable to exclude NSTEMI Likely demand ischemia in the setting of dehydration, flu type symptoms, tachycardia He does need ischemic work-up given mildly depressed ejection fraction on echo 4045% Kidney function limiting cardiac catheterization at this time He prefers he does not want to come back at a later date for cardiac catheterization prefers stress testing -We will reevaluate renal function in the morning, if still not grossly normal will order pharmacologic Myoview D/c heparin tomorrow AM  2) Acute renal failure In the setting of dehydration, viral syndrome Slow improvement  3) transaminitis In the setting of nausea vomiting Tolerated light diet  4) hyponatremia  Back to baseline  5) hypokalemia Would replete aggressively Likely secondary to fluid shifts  6) anxiety On Xanax 0.5 3 times a day Trying to manage 2 houses,  credit card debt  Greater than 50% was spent in counseling and coordination of care with patient Total encounter time 35 minutes or more   Signed: Esmond Plants  M.D., Ph.D. [facility]

## 2018-06-28 NOTE — Progress Notes (Signed)
ANTICOAGULATION CONSULT NOTE - Initial Consult  Pharmacy Consult for Heparin Indication: chest pain/ACS  No Known Allergies  Patient Measurements: Height: 5' 10"  (177.8 cm) Weight: 177 lb 6.4 oz (80.5 kg) IBW/kg (Calculated) : 73 Heparin Dosing Weight: 83.9 kg  Vital Signs: Temp: 98.5 F (36.9 C) (08/28 2043) Temp Source: Oral (08/28 2043) BP: 103/75 (08/28 2043) Pulse Rate: 112 (08/28 2043)  Labs: Recent Labs    06/27/18 1038 06/27/18 1217 06/27/18 1358 06/27/18 1841 06/28/18 0122 06/28/18 0252  HGB 14.7  --   --   --   --  12.9*  HCT 43.0  --   --   --   --  38.0*  PLT 155  --   --   --   --  139*  APTT  --  31  --   --   --   --   LABPROT  --  14.7  --   --   --   --   INR  --  1.16  --   --   --   --   HEPARINUNFRC  --   --   --  0.24*  --  0.24*  CREATININE 3.35*  --   --   --   --   --   TROPONINI 3.90*  --  2.84* 2.58* 2.01*  --     Estimated Creatinine Clearance: 25.4 mL/min (A) (by C-G formula based on SCr of 3.35 mg/dL (H)).   Medical History: Past Medical History:  Diagnosis Date  . Crohn disease ([place])     Medications:  Infusions:  . sodium chloride 125 mL/hr at 06/27/18 2359  . heparin 1,150 Units/hr (06/27/18 2026)    Assessment: 57 yo male with shortness of breath, weakness, dizziness of 3 days.  Elevated troponin of 3.9 consistent with NSTEMI.  HEPARIN COURSE 8/28 Start heparin infusion at 1000 units/hr  06/27/18 @ 18:41 Rate increased to 1150 units/hr   Goal of Therapy:  Heparin level 0.3-0.7 units/ml Monitor platelets by anticoagulation protocol: Yes   Plan:  06/28/18 @ 0252 HL 0.24. Level is subtherapeutic. Will order 1250 unit bolus and increase infusion to 1300 units/hr.   Will recheck HL 6 hours after rate increase.   Pernell Dupre, PharmD, BCPS Clinical Pharmacist 06/28/2018 3:25 AM

## 2018-06-28 NOTE — Progress Notes (Signed)
ANTICOAGULATION CONSULT NOTE   Pharmacy Consult for Heparin Indication: chest pain/ACS  No Known Allergies  Patient Measurements: Height: 5' 10"  (177.8 cm) Weight: 187 lb 3.2 oz (84.9 kg) IBW/kg (Calculated) : 73 Heparin Dosing Weight: 83.9 kg  Vital Signs: Temp: 98.7 F (37.1 C) (08/29 0804) Temp Source: Oral (08/29 0804) BP: 103/78 (08/29 0804) Pulse Rate: 107 (08/29 0804)  Labs: Recent Labs    06/27/18 1038 06/27/18 1217 06/27/18 1358 06/27/18 1841 06/28/18 0122 06/28/18 0252 06/28/18 1002  HGB 14.7  --   --   --   --  12.9*  --   HCT 43.0  --   --   --   --  38.0*  --   PLT 155  --   --   --   --  139*  --   APTT  --  31  --   --   --   --   --   LABPROT  --  14.7  --   --   --   --   --   INR  --  1.16  --   --   --   --   --   HEPARINUNFRC  --   --   --  0.24*  --  0.24* 0.23*  CREATININE 3.35*  --   --   --   --  1.89*  --   TROPONINI 3.90*  --  2.84* 2.58* 2.01*  --   --     Estimated Creatinine Clearance: 45.1 mL/min (A) (by C-G formula based on SCr of 1.89 mg/dL (H)).   Medical History: Past Medical History:  Diagnosis Date  . Crohn disease ([place])     Medications:  Infusions:  . sodium chloride 125 mL/hr at 06/28/18 1008  . heparin 1,300 Units/hr (06/28/18 0336)    Assessment: 57 yo male with shortness of breath, weakness, dizziness of 3 days.  Elevated troponin of 3.9 consistent with NSTEMI.  HEPARIN COURSE 8/28 Start heparin infusion at 1000 units/hr  06/27/18 @ 18:41 Rate increased to 1150 units/hr   Goal of Therapy:  Heparin level 0.3-0.7 units/ml Monitor platelets by anticoagulation protocol: Yes   Plan:  06/28/18 @ 0252 HL 0.24. Level is subtherapeutic. Will order 1250 unit bolus and increase infusion to 1300 units/hr.   Will recheck HL 6 hours after rate increase.   08/29 HL @ 1002=  0.23. Will order Bolus of 1300 units x1 and increase drip rate to 1500 units/hr. Recheck HL in 6 hours.  Noralee Space, PharmD, BCPS Clinical  Pharmacist 06/28/2018 11:34 AM

## 2018-06-29 ENCOUNTER — Encounter: Payer: Self-pay | Admitting: Emergency Medicine

## 2018-06-29 ENCOUNTER — Encounter
Admission: EM | Disposition: A | Discharge status: Home / Self Care | Payer: Self-pay | Source: Home / Self Care | Attending: Internal Medicine

## 2018-06-29 DIAGNOSIS — I214 Non-ST elevation (NSTEMI) myocardial infarction: Principal | ICD-10-CM

## 2018-06-29 HISTORY — PX: LEFT HEART CATH AND CORONARY ANGIOGRAPHY: CATH118249

## 2018-06-29 LAB — HEPATIC FUNCTION PANEL
ALT: 145 U/L — ABNORMAL HIGH (ref 0–44)
AST: 105 U/L — AB (ref 15–41)
Albumin: 2.2 g/dL — ABNORMAL LOW (ref 3.5–5.0)
Alkaline Phosphatase: 164 U/L — ABNORMAL HIGH (ref 38–126)
BILIRUBIN DIRECT: 0.3 mg/dL — AB (ref 0.0–0.2)
Indirect Bilirubin: 1 mg/dL — ABNORMAL HIGH (ref 0.3–0.9)
Total Bilirubin: 1.3 mg/dL — ABNORMAL HIGH (ref 0.3–1.2)
Total Protein: 5.6 g/dL — ABNORMAL LOW (ref 6.5–8.1)

## 2018-06-29 LAB — BASIC METABOLIC PANEL
Anion gap: 3 — ABNORMAL LOW (ref 5–15)
BUN: 22 mg/dL — ABNORMAL HIGH (ref 6–20)
CHLORIDE: 111 mmol/L (ref 98–111)
CO2: 22 mmol/L (ref 22–32)
CREATININE: 1.31 mg/dL — AB (ref 0.61–1.24)
Calcium: 7.2 mg/dL — ABNORMAL LOW (ref 8.9–10.3)
GFR calc non Af Amer: 59 mL/min — ABNORMAL LOW (ref >60)
Glucose, Bld: 119 mg/dL — ABNORMAL HIGH (ref 70–99)
POTASSIUM: 3.3 mmol/L — AB (ref 3.5–5.1)
SODIUM: 136 mmol/L (ref 135–145)

## 2018-06-29 LAB — HEPARIN LEVEL (UNFRACTIONATED)
HEPARIN UNFRACTIONATED: 0.42 [IU]/mL (ref 0.30–0.70)
Heparin Unfractionated: 0.26 IU/mL — ABNORMAL LOW (ref 0.30–0.70)

## 2018-06-29 LAB — CBC
HEMATOCRIT: 36.7 % — AB (ref 40.0–52.0)
Hemoglobin: 12.4 g/dL — ABNORMAL LOW (ref 13.0–18.0)
MCH: 30.5 pg (ref 26.0–34.0)
MCHC: 34 g/dL (ref 32.0–36.0)
MCV: 89.7 fL (ref 80.0–100.0)
PLATELETS: 201 10*3/uL (ref 150–440)
RBC: 4.09 MIL/uL — AB (ref 4.40–5.90)
RDW: 14.8 % — ABNORMAL HIGH (ref 11.5–14.5)
WBC: 11.9 10*3/uL — AB (ref 3.8–10.6)

## 2018-06-29 SURGERY — LEFT HEART CATH AND CORONARY ANGIOGRAPHY
Anesthesia: Moderate Sedation | Laterality: Right

## 2018-06-29 MED ORDER — SODIUM CHLORIDE 0.9 % WEIGHT BASED INFUSION: 1.0000 mL/kg/h | INTRAVENOUS | Status: DC

## 2018-06-29 MED ORDER — MIDAZOLAM HCL 2 MG/2ML IJ SOLN
INTRAMUSCULAR | Status: DC | PRN
  Administered 2018-06-29 (×2): 1 mg via INTRAVENOUS

## 2018-06-29 MED ORDER — IOPAMIDOL (ISOVUE-300) INJECTION 61%
INTRAVENOUS | Status: DC | PRN
  Administered 2018-06-29: 80 mL via INTRAVENOUS

## 2018-06-29 MED ORDER — CARVEDILOL 3.125 MG PO TABS: 3.1250 mg | ORAL_TABLET | Freq: Two times a day (BID) | ORAL | 0 refills | Status: DC

## 2018-06-29 MED ORDER — HEPARIN BOLUS VIA INFUSION
1300.0000 [IU] | Freq: Once | INTRAVENOUS | Status: AC
  Administered 2018-06-29: 1300 [IU] via INTRAVENOUS
  Filled 2018-06-29: qty 1300

## 2018-06-29 MED ORDER — LISINOPRIL 5 MG PO TABS: 5.0000 mg | ORAL_TABLET | Freq: Every day | ORAL | 11 refills | Status: DC

## 2018-06-29 MED ORDER — ASPIRIN 81 MG PO CHEW: 81.0000 mg | CHEWABLE_TABLET | ORAL | Status: DC

## 2018-06-29 MED ORDER — POTASSIUM CHLORIDE CRYS ER 20 MEQ PO TBCR
40.0000 meq | EXTENDED_RELEASE_TABLET | Freq: Once | ORAL | Status: AC
  Administered 2018-06-29: 40 meq via ORAL
  Filled 2018-06-29: qty 2

## 2018-06-29 MED ORDER — SODIUM CHLORIDE 0.9 % WEIGHT BASED INFUSION: 3.0000 mL/kg/h | INTRAVENOUS | Status: AC

## 2018-06-29 MED ORDER — FENTANYL CITRATE (PF) 100 MCG/2ML IJ SOLN
INTRAMUSCULAR | Status: AC
  Filled 2018-06-29: qty 2

## 2018-06-29 MED ORDER — FENTANYL CITRATE (PF) 100 MCG/2ML IJ SOLN
INTRAMUSCULAR | Status: DC | PRN
  Administered 2018-06-29 (×2): 25 ug via INTRAVENOUS

## 2018-06-29 MED ORDER — MIDAZOLAM HCL 2 MG/2ML IJ SOLN
INTRAMUSCULAR | Status: AC
  Filled 2018-06-29: qty 2

## 2018-06-29 SURGICAL SUPPLY — 9 items
CATH INFINITI 5FR ANG PIGTAIL (CATHETERS) ×2 IMPLANT
CATH INFINITI 5FR JL4 (CATHETERS) ×2 IMPLANT
CATH INFINITI JR4 5F (CATHETERS) ×2 IMPLANT
DEVICE CLOSURE MYNXGRIP 5F (Vascular Products) ×2 IMPLANT
KIT MANI 3VAL PERCEP (MISCELLANEOUS) ×2 IMPLANT
NEEDLE PERC 18GX7CM (NEEDLE) ×2 IMPLANT
PACK CARDIAC CATH (CUSTOM PROCEDURE TRAY) ×2 IMPLANT
SHEATH AVANTI 5FR X 11CM (SHEATH) ×2 IMPLANT
WIRE GUIDERIGHT .035X150 (WIRE) ×2 IMPLANT

## 2018-06-29 NOTE — Progress Notes (Signed)
ANTICOAGULATION CONSULT NOTE   Pharmacy Consult for Heparin Indication: chest pain/ACS  No Known Allergies  Patient Measurements: Height: 5' 10"  (177.8 cm) Weight: 189 lb 11.2 oz (86 kg) IBW/kg (Calculated) : 73 Heparin Dosing Weight: 83.9 kg  Vital Signs: Temp: 97.4 F (36.3 C) (08/30 0811) Temp Source: Oral (08/30 0811) BP: 112/82 (08/30 0811) Pulse Rate: 100 (08/30 0811)  Labs: Recent Labs    06/27/18 1038 06/27/18 1217 06/27/18 1358  06/27/18 1841 06/28/18 0122 06/28/18 0252  06/28/18 1823 06/29/18 0108 06/29/18 0824  HGB 14.7  --   --   --   --   --  12.9*  --   --  12.4*  --   HCT 43.0  --   --   --   --   --  38.0*  --   --  36.7*  --   PLT 155  --   --   --   --   --  139*  --   --  201  --   APTT  --  31  --   --   --   --   --   --   --   --   --   LABPROT  --  14.7  --   --   --   --   --   --   --   --   --   INR  --  1.16  --   --   --   --   --   --   --   --   --   HEPARINUNFRC  --   --   --    < > 0.24*  --  0.24*   < > 0.30 0.26* 0.42  CREATININE 3.35*  --   --   --   --   --  1.89*  --   --  1.31*  --   TROPONINI 3.90*  --  2.84*  --  2.58* 2.01*  --   --   --   --   --    < > = values in this interval not displayed.    Estimated Creatinine Clearance: 65 mL/min (A) (by C-G formula based on SCr of 1.31 mg/dL (H)).   Medical History: Past Medical History:  Diagnosis Date  . Crohn disease ([place])     Medications:  Infusions:  . sodium chloride 125 mL/hr at 06/29/18 0213  . sodium chloride     Followed by  . sodium chloride    . heparin 1,700 Units/hr (06/29/18 0210)    Assessment: 57 yo male with shortness of breath, weakness, dizziness of 3 days.  Elevated troponin of 3.9 consistent with NSTEMI.  HEPARIN COURSE 8/28  Start heparin infusion at 1000 units/hr 06/27/18 @ 18:41 Rate increased to 1150 units/hr  06/28/18 @ 0252 HL 0.24. Level is subtherapeutic. Will order 1250 unit bolus and increase infusion to 1300 units/hr.   Will recheck  HL 6 hours after rate increase.  08/29 HL @ 1002=  0.23. Will order Bolus of 1300 units x1 and increase drip rate to 1500 units/hr. Recheck HL in 6 hours 06/28/18 18:23 HL therapeutic x 1. Continue current rate. Will recheck HL in 6 hours.  Goal of Therapy:  Heparin level 0.3-0.7 units/ml Monitor platelets by anticoagulation protocol: Yes   Plan:  06/29/18 @ 0130 HL subtherapeutic @ 0.26. Will order 1300u heparin bolus and increase infusion to 1700units/hr. Will recheck HL in 6  hours. CBC with AM labs per protocol   8/30 HL@0824 = 0.42. Will continue with current drip rate. Will check confirmatory level in 6 hours.  Noralee Space, PharmD, BCPS Clinical Pharmacist 06/29/2018 9:09 AM

## 2018-06-29 NOTE — Progress Notes (Signed)
Discharge instructions explained to pt/ verbalized an understanding/ iv and tele removed/ will transport off unit via wheelchair.  

## 2018-06-29 NOTE — Discharge Summary (Addendum)
[facility] [place] NAME: Justin Fernandez    MR#:  [id number]  DATE OF BIRTH:  1961/05/05  DATE OF ADMISSION:  06/27/2018 ADMITTING PHYSICIAN: Bettey Costa, MD  DATE OF DISCHARGE: 06/29/2018  PRIMARY CARE PHYSICIAN: [place] Pink, MD   ADMISSION DIAGNOSIS:  NSTEMI (non-ST elevated myocardial infarction) ([place]) [I21.4] Acute renal failure, unspecified acute renal failure type ([place]) [N17.9] Transaminitis Viral syndrome Nausea and vomiting DISCHARGE DIAGNOSIS:  Active Problems:   Elevated troponin Cardiomyopathy Hypokalemia Viral syndrome Acute kidney injury Transaminitis secondary to viral syndrome Elevated troponin secondary to renal failure SECONDARY DIAGNOSIS:   Past Medical History:  Diagnosis Date  . Crohn disease ([place])      ADMITTING HISTORY [name]  is a 57 y.o. male with a known history of Crohn's disease status post partial bowel resection over 30 years ago and tobacco dependence who presents to the emergency room due to generalized weakness.  Patient reports over the past several days he has had nausea, vomiting and diarrhea however the symptoms have improved.  He has been very weak and so comes to the ER for further evaluation.  In the emergency room he is noted to have acute kidney injury and elevated troponin.  Patient denies chest pain, shortness of breath, fever or chills.  Patient denies any urinary symptoms however he has had decreased urine output over the past 24 hours.  Patient denies abdominal pain and has mentioned above nausea, vomiting and diarrhea have improved.  Patient does report feelings of dizziness with lightheadedness however denies syncope  HOSPITAL COURSE:  Patient was admitted to telemetry and troponins were trended.  Was seen by cardiology was worked up with echocardiogram.  He was hydrated with IV fluids and renal functions were monitored.  Kidney functions improved with IV fluid hydration.  Patient  also had elevated liver function tests which were monitored and trended.  Patient was worked up with cardiac catheterization by cardiology which did not reveal any obstruction.  Echocardiogram revealed a diastolic dysfunction.  Patient's potassium was supplemented during hospitalization. Patient will be discharged on ACE inhibitor and beta-blocker and follow-up with primary care physician in the clinic.  Hepatotoxic drugs to be avoided.  CONSULTS OBTAINED:  Treatment Team:  Minna Merritts, MD  DRUG ALLERGIES:  No Known Allergies  DISCHARGE MEDICATIONS:   Allergies as of 06/29/2018   No Known Allergies     Medication List    TAKE these medications   ALPRAZolam 0.5 MG tablet Commonly known as:  XANAX Take 0.5 mg by mouth 3 (three) times daily.   carvedilol 3.125 MG tablet Commonly known as:  COREG Take 1 tablet (3.125 mg total) by mouth 2 (two) times daily.   cyanocobalamin 1000 MCG/ML injection Commonly known as:  (VITAMIN B-12) Inject 1,000 mcg into the skin every 30 (thirty) days.   lisinopril 5 MG tablet Commonly known as:  PRINIVIL,ZESTRIL Take 1 tablet (5 mg total) by mouth daily.   loperamide 2 MG capsule Commonly known as:  IMODIUM Take 2-4 mg by mouth daily.   MULTI-VITAMINS Tabs Take 1 tablet by mouth daily.   vitamin C 500 MG tablet Commonly known as:  ASCORBIC ACID Take 500 mg by mouth daily.       Today  Patient seen today Decreased nausea and vomiting Tolerating diet well No abdominal pain Had cardiac catheterization which revealed no obstruction  VITAL SIGNS:  Blood pressure 117/85, pulse 96, temperature (!) 97.5 F (36.4 C), temperature source  Oral, resp. rate 18, height 5' 10"  (1.778 m), weight 86 kg, SpO2 96 %.  I/O:    Intake/Output Summary (Last 24 hours) at 06/29/2018 1508 Last data filed at 06/29/2018 0210 Gross per 24 hour  Intake 281.67 ml  Output -  Net 281.67 ml    PHYSICAL EXAMINATION:  Physical Exam  GENERAL:  57  y.o.-year-old patient lying in the bed with no acute distress.  LUNGS: Normal breath sounds bilaterally, no wheezing, rales,rhonchi or crepitation. No use of accessory muscles of respiration.  CARDIOVASCULAR: S1, S2 normal. No murmurs, rubs, or gallops.  ABDOMEN: Soft, non-tender, non-distended. Bowel sounds present. No organomegaly or mass.  NEUROLOGIC: Moves all 4 extremities. PSYCHIATRIC: The patient is alert and oriented x 3.  SKIN: No obvious rash, lesion, or ulcer.   DATA REVIEW:   CBC Recent Labs  Lab 06/29/18 0108  WBC 11.9*  HGB 12.4*  HCT 36.7*  PLT 201    Chemistries  Recent Labs  Lab 06/28/18 1002 06/29/18 0108  NA  --  136  K 3.0* 3.3*  CL  --  111  CO2  --  22  GLUCOSE  --  119*  BUN  --  22*  CREATININE  --  1.31*  CALCIUM  --  7.2*  MG 2.6*  --   AST  --  105*  ALT  --  145*  ALKPHOS  --  164*  BILITOT  --  1.3*    Cardiac Enzymes Recent Labs  Lab 06/28/18 0122  TROPONINI 2.01*    Microbiology Results  No results found for this or any previous visit.  RADIOLOGY:  No results found.  Follow up with PCP in 1 week.  Management plans discussed with the patient, family and they are in agreement.  CODE STATUS: Full code    Code Status Orders  (From admission, onward)         Start     Ordered   06/27/18 1327  Full code  Continuous     06/27/18 1326        Code Status History    This patient has a current code status but no historical code status.    Advance Directive Documentation     Most Recent Value  Type of Advance Directive  Healthcare Power of Attorney, Living will  Pre-existing out of facility DNR order (yellow form or pink MOST form)  -  "MOST" Form in Place?  -      TOTAL TIME TAKING CARE OF THIS PATIENT ON DAY OF DISCHARGE: more than 35 minutes.   Saundra Shelling M.D on 06/29/2018 at 3:08 PM  Between 7am to 6pm - Pager - [phone]  After 6pm go to [email or web address] - password [email or web address]  [facility]  Office  [phone]  CC: Primary care physician; Maryland Pink, MD  Note: This dictation was prepared with Dragon dictation along with smaller phrase technology. Any transcriptional errors that result from this process are unintentional.

## 2018-06-29 NOTE — Progress Notes (Signed)
Cardiac cath for NSTEMI  No significant coronary disease Normal EF, >55%  Recommendations:  Medical management D/c later today Follow up with PMD  Signed, Esmond Plants, MD, Ph.D [facility]

## 2018-06-29 NOTE — Progress Notes (Signed)
ANTICOAGULATION CONSULT NOTE   Pharmacy Consult for Heparin Indication: chest pain/ACS  No Known Allergies  Patient Measurements: Height: 5' 10"  (177.8 cm) Weight: 187 lb 3.2 oz (84.9 kg) IBW/kg (Calculated) : 73 Heparin Dosing Weight: 83.9 kg  Vital Signs: Temp: 99.2 F (37.3 C) (08/29 1919) Temp Source: Oral (08/29 1919) BP: 111/82 (08/29 1919) Pulse Rate: 111 (08/29 1919)  Labs: Recent Labs    06/27/18 1038 06/27/18 1217 06/27/18 1358  06/27/18 1841 06/28/18 0122 06/28/18 0252 06/28/18 1002 06/28/18 1823 06/29/18 0108  HGB 14.7  --   --   --   --   --  12.9*  --   --  12.4*  HCT 43.0  --   --   --   --   --  38.0*  --   --  36.7*  PLT 155  --   --   --   --   --  139*  --   --  201  APTT  --  31  --   --   --   --   --   --   --   --   LABPROT  --  14.7  --   --   --   --   --   --   --   --   INR  --  1.16  --   --   --   --   --   --   --   --   HEPARINUNFRC  --   --   --    < > 0.24*  --  0.24* 0.23* 0.30 0.26*  CREATININE 3.35*  --   --   --   --   --  1.89*  --   --  1.31*  TROPONINI 3.90*  --  2.84*  --  2.58* 2.01*  --   --   --   --    < > = values in this interval not displayed.    Estimated Creatinine Clearance: 65 mL/min (A) (by C-G formula based on SCr of 1.31 mg/dL (H)).   Medical History: Past Medical History:  Diagnosis Date  . Crohn disease ([place])     Medications:  Infusions:  . sodium chloride 125 mL/hr at 06/28/18 1819  . heparin 1,500 Units/hr (06/28/18 2331)    Assessment: 57 yo male with shortness of breath, weakness, dizziness of 3 days.  Elevated troponin of 3.9 consistent with NSTEMI.  HEPARIN COURSE 8/28  Start heparin infusion at 1000 units/hr 06/27/18 @ 18:41 Rate increased to 1150 units/hr  06/28/18 @ 0252 HL 0.24. Level is subtherapeutic. Will order 1250 unit bolus and increase infusion to 1300 units/hr.   Will recheck HL 6 hours after rate increase.  08/29 HL @ 1002=  0.23. Will order Bolus of 1300 units x1 and increase  drip rate to 1500 units/hr. Recheck HL in 6 hours 06/28/18 18:23 HL therapeutic x 1. Continue current rate. Will recheck HL in 6 hours.  Goal of Therapy:  Heparin level 0.3-0.7 units/ml Monitor platelets by anticoagulation protocol: Yes   Plan:  06/29/18 @ 0130 HL subtherapeutic @ 0.26. Will order 1300u heparin bolus and increase infusion to 1700units/hr. Will recheck HL in 6 hours. CBC with AM labs per protocol   Pernell Dupre, PharmD, BCPS Clinical Pharmacist 06/29/2018 2:07 AM

## 2018-07-03 DIAGNOSIS — N289 Disorder of kidney and ureter, unspecified: Secondary | ICD-10-CM | POA: Diagnosis not present

## 2018-07-03 DIAGNOSIS — R739 Hyperglycemia, unspecified: Secondary | ICD-10-CM | POA: Diagnosis not present

## 2018-07-03 DIAGNOSIS — E876 Hypokalemia: Secondary | ICD-10-CM | POA: Diagnosis not present

## 2018-07-04 ENCOUNTER — Telehealth: Payer: Self-pay

## 2018-07-04 NOTE — Telephone Encounter (Signed)
EMMI Follow-up: Noted on the report that the patient hadn't read his discharge paperwork.  I talked with Justin Fernandez and he had an opportunity to review his paperwork and followed up with Dr. Kary Kos on Tuesday and was aware of appointment with Dr. Rockey Situ. No other needs noted at this time.  I let him know there would be a second automated call with a different series of questions and to let us know at that time if he had any concerns.

## 2018-07-29 DIAGNOSIS — F17200 Nicotine dependence, unspecified, uncomplicated: Secondary | ICD-10-CM | POA: Insufficient documentation

## 2018-07-29 DIAGNOSIS — K5090 Crohn's disease, unspecified, without complications: Secondary | ICD-10-CM | POA: Insufficient documentation

## 2018-07-29 DIAGNOSIS — I214 Non-ST elevation (NSTEMI) myocardial infarction: Secondary | ICD-10-CM | POA: Insufficient documentation

## 2018-07-29 DIAGNOSIS — N179 Acute kidney failure, unspecified: Secondary | ICD-10-CM | POA: Insufficient documentation
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
## 2018-07-30 ENCOUNTER — Ambulatory Visit (INDEPENDENT_AMBULATORY_CARE_PROVIDER_SITE_OTHER): Payer: 59 | Admitting: Cardiovascular Disease

## 2018-07-30 ENCOUNTER — Encounter: Payer: Self-pay | Admitting: Cardiovascular Disease

## 2018-07-30 DIAGNOSIS — F17200 Nicotine dependence, unspecified, uncomplicated: Secondary | ICD-10-CM

## 2018-07-30 DIAGNOSIS — N170 Acute kidney failure with tubular necrosis: Secondary | ICD-10-CM

## 2018-07-30 DIAGNOSIS — I214 Non-ST elevation (NSTEMI) myocardial infarction: Secondary | ICD-10-CM | POA: Diagnosis not present

## 2018-07-30 DIAGNOSIS — K50919 Crohn's disease, unspecified, with unspecified complications: Secondary | ICD-10-CM | POA: Diagnosis not present

## 2018-07-30 NOTE — Patient Instructions (Addendum)
Medication Instructions:   Take lisinopril 5 mg every other day for 2 months, then stop the medication  Labwork:  No new labs needed  Testing/Procedures:  No further testing at this time   Follow-Up: It was a pleasure seeing you in the office today. Please call us if you have new issues that need to be addressed before your next appt.  [phone]  Your physician wants you to follow-up in:  As needed  If you need a refill on your cardiac medications before your next appointment, please call your pharmacy.  For educational health videos Log in to : [email or web address] Or : [email or web address], password : [email or web address]

## 2019-05-09 IMAGING — CR DG CHEST 2V
2 series · 2 of 2 positions shown · non-contrast
Comparison: None.

CLINICAL DATA: Shortness of breath.

EXAM:
CHEST - 2 VIEW

[chest lat]
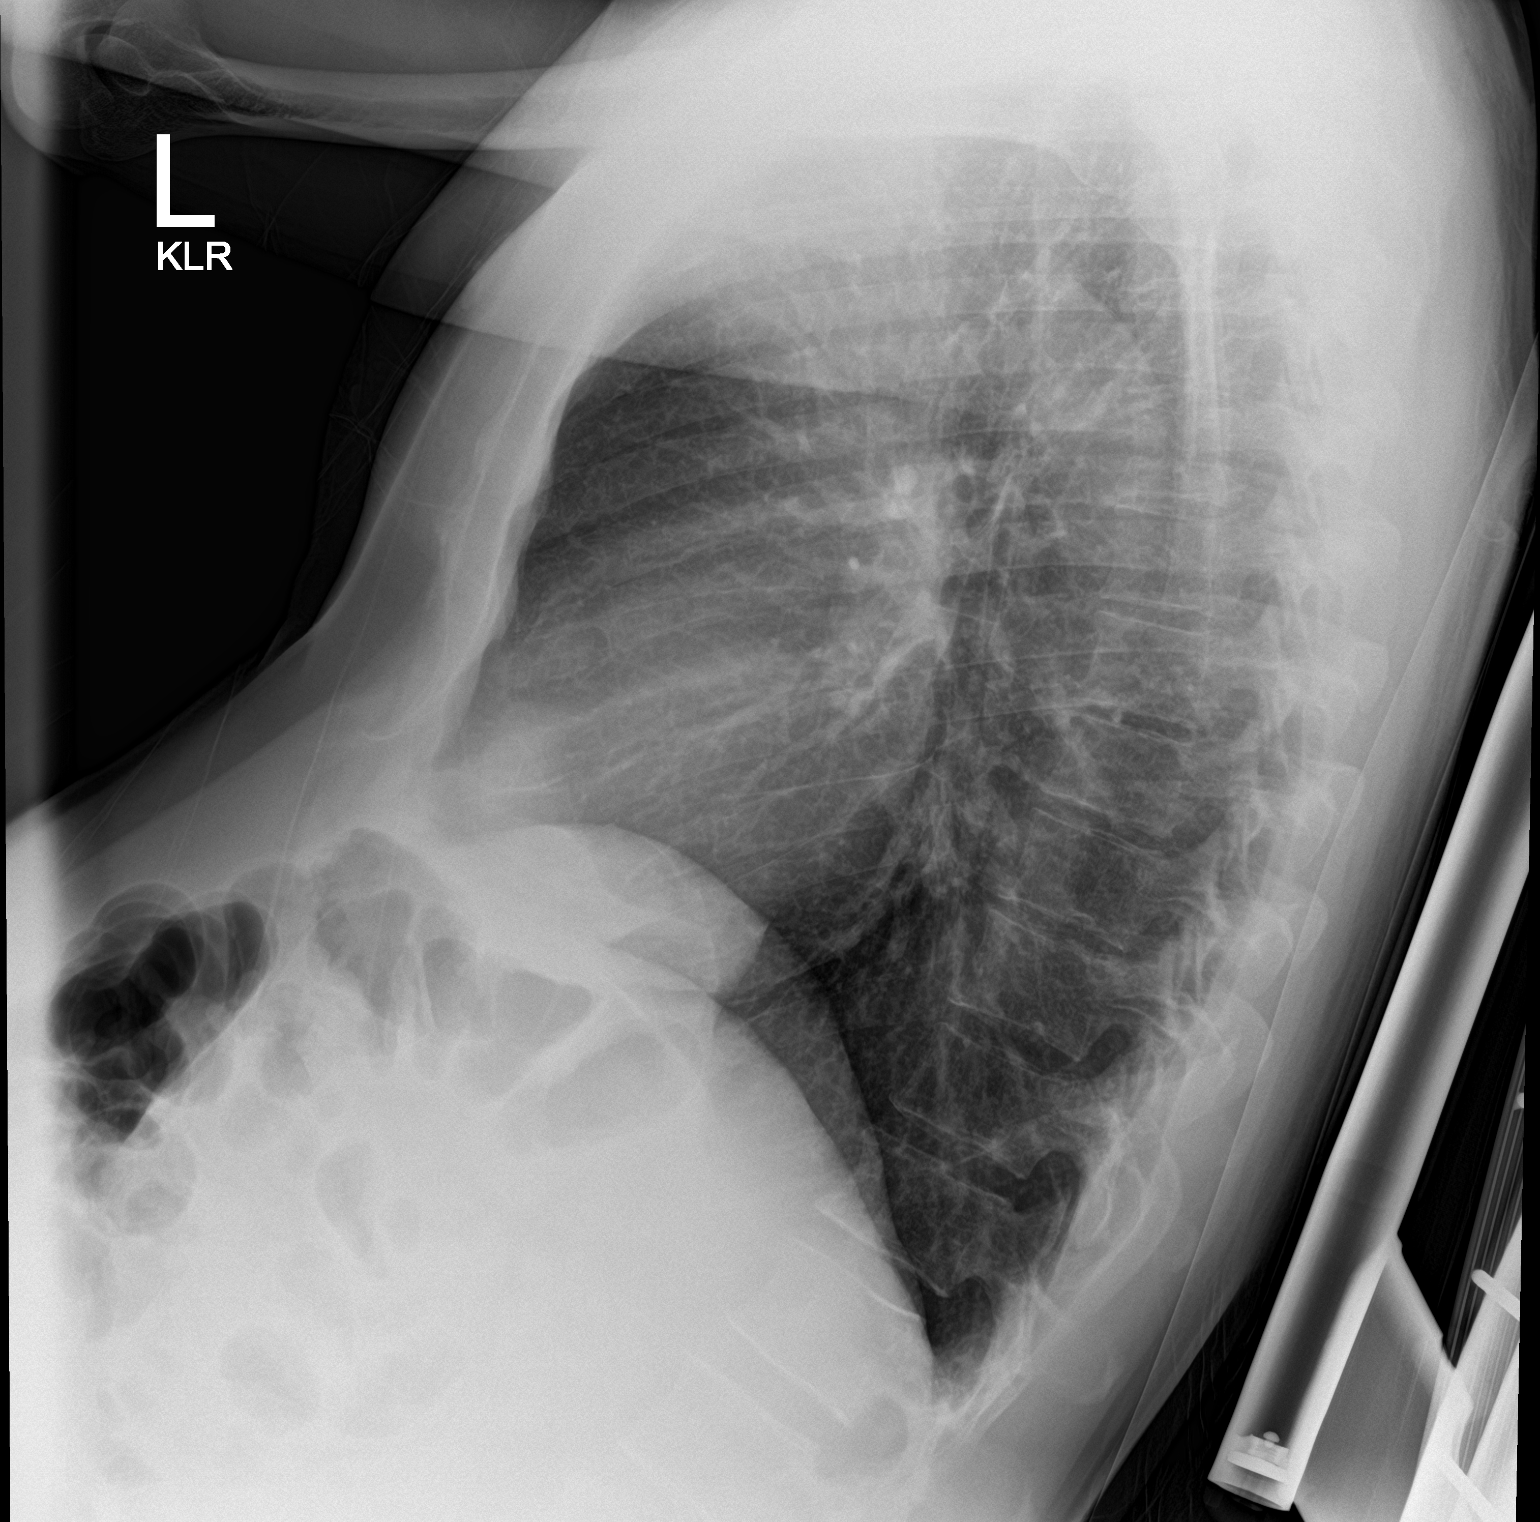

[chest ap]
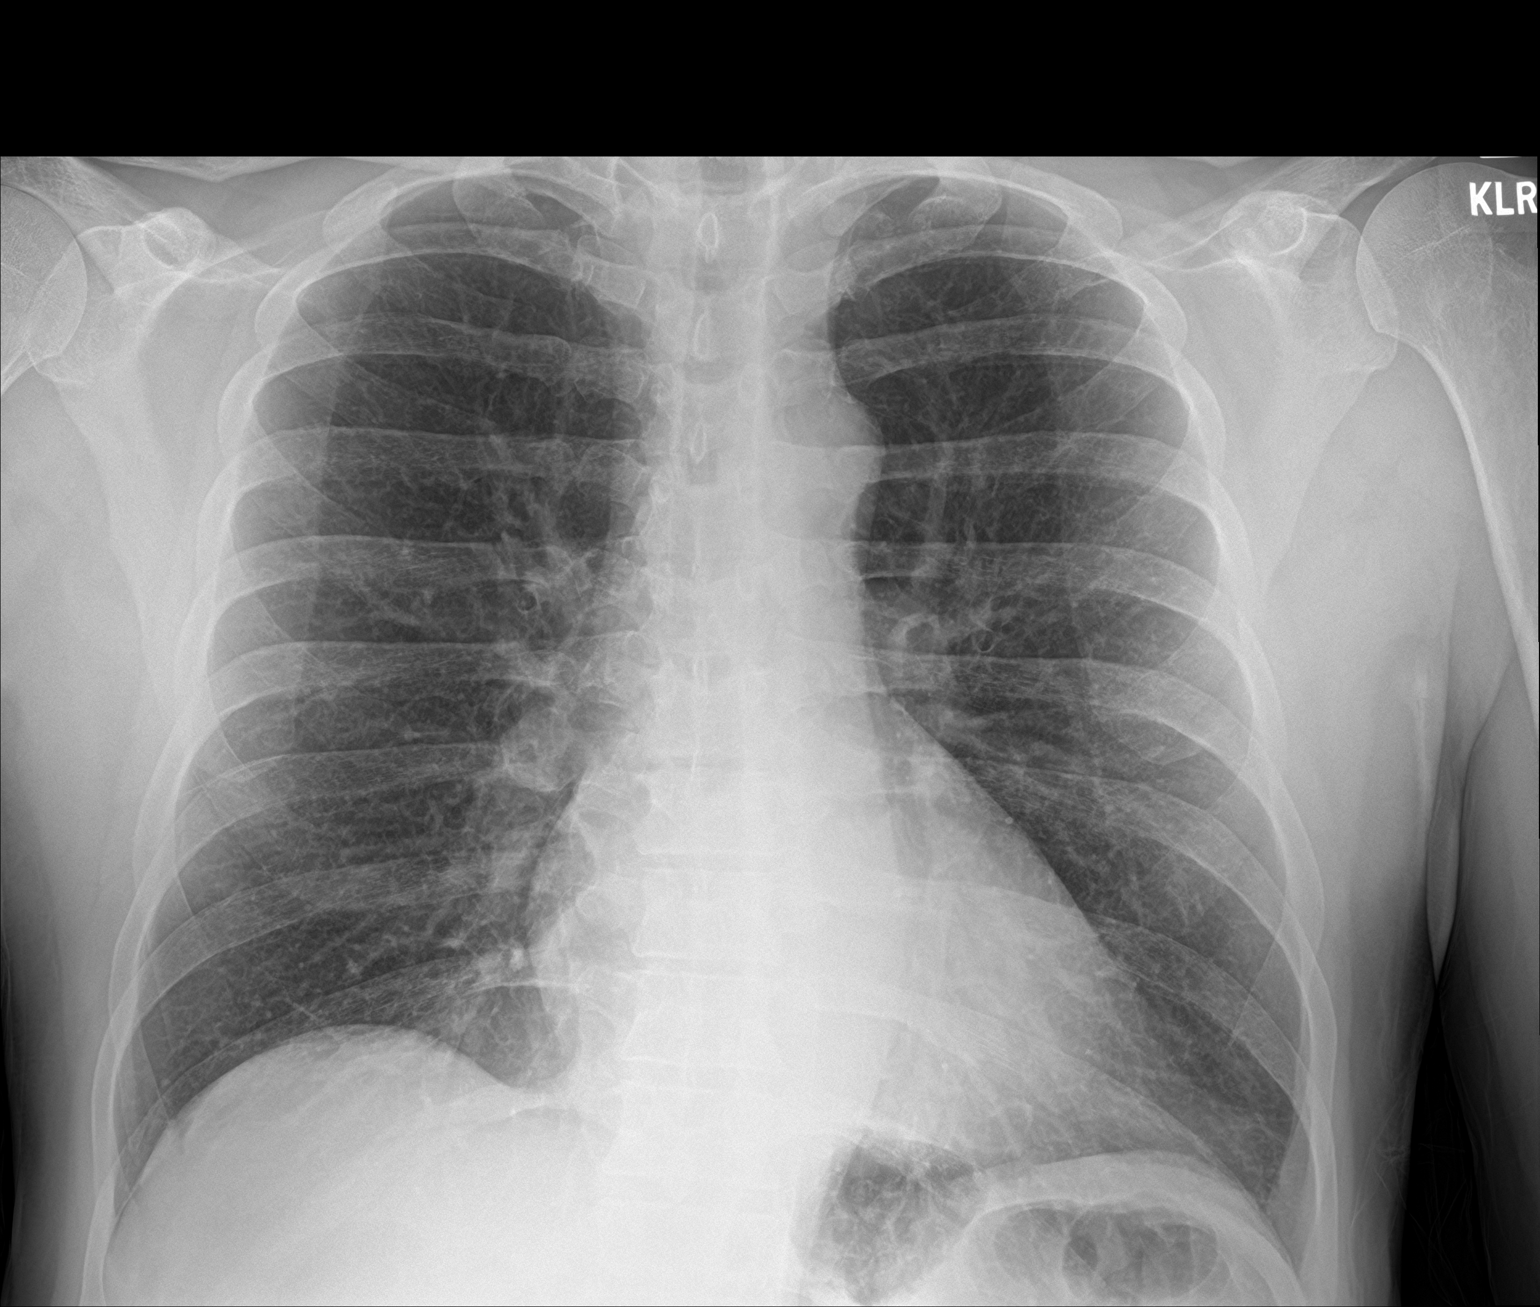

[2 of 2 positions shown; findings below may reference images not displayed]

FINDINGS: The heart size and mediastinal contours are within normal limits.
Both lungs are clear. No pneumothorax or pleural effusion is noted.
The visualized skeletal structures are unremarkable.
IMPRESSION: No active cardiopulmonary disease.

## 2019-07-05 ENCOUNTER — Emergency Department
Admission: EM | Admit: 2019-07-05 | Discharge: 2019-07-05 | Disposition: A | Discharge status: Home / Self Care | Payer: 59 | Attending: Emergency Medicine | Admitting: Emergency Medicine

## 2019-07-05 ENCOUNTER — Emergency Department: Payer: 59

## 2019-07-05 ENCOUNTER — Other Ambulatory Visit: Payer: Self-pay

## 2019-07-05 DIAGNOSIS — Z79899 Other long term (current) drug therapy: Secondary | ICD-10-CM | POA: Insufficient documentation

## 2019-07-05 DIAGNOSIS — R109 Unspecified abdominal pain: Secondary | ICD-10-CM | POA: Diagnosis present

## 2019-07-05 DIAGNOSIS — I252 Old myocardial infarction: Secondary | ICD-10-CM | POA: Insufficient documentation

## 2019-07-05 DIAGNOSIS — Z87891 Personal history of nicotine dependence: Secondary | ICD-10-CM | POA: Insufficient documentation

## 2019-07-05 DIAGNOSIS — N200 Calculus of kidney: Secondary | ICD-10-CM | POA: Insufficient documentation

## 2019-07-05 LAB — LIPASE, BLOOD: Lipase: 34 U/L (ref 11–51)

## 2019-07-05 LAB — URINALYSIS, COMPLETE (UACMP) WITH MICROSCOPIC
Bacteria, UA: NONE SEEN
Bilirubin Urine: NEGATIVE
Glucose, UA: NEGATIVE mg/dL
Ketones, ur: 5 mg/dL — AB
Nitrite: NEGATIVE
Protein, ur: NEGATIVE mg/dL
Specific Gravity, Urine: 1.005 (ref 1.005–1.030)
Squamous Epithelial / LPF: NONE SEEN (ref 0–5)
pH: 6 (ref 5.0–8.0)

## 2019-07-05 LAB — COMPREHENSIVE METABOLIC PANEL
ALT: 18 U/L (ref 0–44)
AST: 21 U/L (ref 15–41)
Albumin: 4.2 g/dL (ref 3.5–5.0)
Alkaline Phosphatase: 53 U/L (ref 38–126)
Anion gap: 12 (ref 5–15)
BUN: 14 mg/dL (ref 6–20)
CO2: 22 mmol/L (ref 22–32)
Calcium: 8.7 mg/dL — ABNORMAL LOW (ref 8.9–10.3)
Chloride: 102 mmol/L (ref 98–111)
Creatinine, Ser: 1.72 mg/dL — ABNORMAL HIGH (ref 0.61–1.24)
GFR calc Af Amer: 50 mL/min — ABNORMAL LOW (ref >60)
GFR calc non Af Amer: 43 mL/min — ABNORMAL LOW (ref >60)
Glucose, Bld: 99 mg/dL (ref 70–99)
Potassium: 3.2 mmol/L — ABNORMAL LOW (ref 3.5–5.1)
Sodium: 136 mmol/L (ref 135–145)
Total Bilirubin: 1.8 mg/dL — ABNORMAL HIGH (ref 0.3–1.2)
Total Protein: 6.8 g/dL (ref 6.5–8.1)

## 2019-07-05 LAB — CBC
HCT: 43 % (ref 39.0–52.0)
Hemoglobin: 14.6 g/dL (ref 13.0–17.0)
MCH: 30.3 pg (ref 26.0–34.0)
MCHC: 34 g/dL (ref 30.0–36.0)
MCV: 89.2 fL (ref 80.0–100.0)
Platelets: 300 10*3/uL (ref 150–400)
RBC: 4.82 MIL/uL (ref 4.22–5.81)
RDW: 12.6 % (ref 11.5–15.5)
WBC: 15.8 10*3/uL — ABNORMAL HIGH (ref 4.0–10.5)
nRBC: 0 % (ref 0.0–0.2)

## 2019-07-05 MED ORDER — IOHEXOL 300 MG/ML  SOLN
75.0000 mL | Freq: Once | INTRAMUSCULAR | Status: AC | PRN
  Administered 2019-07-05: 75 mL via INTRAVENOUS

## 2019-07-05 MED ORDER — TAMSULOSIN HCL 0.4 MG PO CAPS: 0.4000 mg | ORAL_CAPSULE | Freq: Every day | ORAL | 0 refills | Status: DC

## 2019-07-05 MED ORDER — IOHEXOL 240 MG/ML SOLN
50.0000 mL | Freq: Once | INTRAMUSCULAR | Status: AC | PRN
  Administered 2019-07-05: 15:00:00 50 mL via ORAL

## 2019-07-05 MED ORDER — ONDANSETRON 4 MG PO TBDP: 4.0000 mg | ORAL_TABLET | Freq: Three times a day (TID) | ORAL | 0 refills | Status: DC | PRN

## 2019-07-05 MED ORDER — OXYCODONE-ACETAMINOPHEN 5-325 MG PO TABS: 1.0000 | ORAL_TABLET | ORAL | 0 refills | Status: DC | PRN

## 2019-07-05 NOTE — ED Notes (Signed)
NAD noted at time of D/C. Pt denies questions or concerns. Pt ambulatory to the lobby at this time.  

## 2019-07-05 NOTE — ED Triage Notes (Signed)
C/o intermittent bilateral flank pain X 1 week. Reports last BM was today, making gas. Pt sent from [facility] for more imaging due to xray showing "possible ilius vs SBO". Pt reports urinary frequency. Pt alert and oriented X4, cooperative, RR even and unlabored, color WNL. Pt in NAD.

## 2019-07-05 NOTE — ED Provider Notes (Signed)
[facility] Emergency Department Provider Note  Time seen: 3:39 PM  I have reviewed the triage vital signs and the nursing notes.   HISTORY  Chief Complaint Flank Pain   HPI Justin Fernandez is a 58 y.o. male with a past medical history of Crohn's disease presents to the emergency department for bilateral flank pain.  According to the patient for the past 1 week he has been experiencing bilateral flank pain, some radiation into his testicles and urge to urinate although very little urine comes out.  Patient finally went to the walk-in clinic today due to increasing pain last night.  At the walk-in clinic they did an x-ray showing possible ileus and the patient was sent to the emergency department for further evaluation.  Here the patient appears well, states 4/10 dull pain currently in bilateral mid back.  Denies any dysuria although does state an urge to urinate at times.  Denies any current testicular pain but states he has been having intermittent pain shooting down into the testicle.  No history of kidney stones.  No hematuria.  No fever cough congestion or shortness of breath.   Past Medical History:  Diagnosis Date  . Crohn disease [facility])     Patient Active Problem List   Diagnosis Date Noted  . Non-ST elevation (NSTEMI) myocardial infarction ([place]) 07/29/2018  . Acute renal failure (ARF) ([place]) 07/29/2018  . Smoker 07/29/2018  . Crohn's disease ([place]) 07/29/2018  . Elevated troponin 06/27/2018    Past Surgical History:  Procedure Laterality Date  . bowel resection    . LEFT HEART CATH AND CORONARY ANGIOGRAPHY Right 06/29/2018   Procedure: LEFT HEART CATH AND CORONARY ANGIOGRAPHY possible PCI;  Surgeon: Minna Merritts, MD;  Location: [place] [facility];  Service: Cardiovascular;  Laterality: Right;    Prior to Admission medications   Medication Sig Start Date End Date Taking? Authorizing Provider  ALPRAZolam Duanne Moron) 0.5 MG tablet Take 0.5 mg by mouth 3  (three) times daily. 06/14/18   [provider]  cyanocobalamin (,VITAMIN B-12,) 1000 MCG/ML injection Inject 1,000 mcg into the skin every 30 (thirty) days. 06/05/18   [provider]  lisinopril (PRINIVIL,ZESTRIL) 5 MG tablet Take 1 tablet (5 mg total) by mouth every other day. 07/30/18   Minna Merritts, MD  loperamide (IMODIUM) 2 MG capsule Take 2-4 mg by mouth daily.    [provider]  Multiple Vitamin (MULTI-VITAMINS) TABS Take 1 tablet by mouth daily.    [provider]  vitamin C (ASCORBIC ACID) 500 MG tablet Take 500 mg by mouth daily.    [provider]    No Known Allergies  Family History  Problem Relation Age of Onset  . Breast cancer Mother   . Colon cancer Father   . Stroke Maternal Grandmother   . Stroke Maternal Grandfather     Social History Social History   Tobacco Use  . Smoking status: Former Research scientist (life sciences)  . Smokeless tobacco: Former Network engineer Use Topics  . Alcohol use: Never    Frequency: Never  . Drug use: Never    Review of Systems Constitutional: Negative for fever. ENT: Negative for recent illness/congestion Cardiovascular: Negative for chest pain. Respiratory: Negative for shortness of breath. Gastrointestinal: Negative for abdominal pain Genitourinary: Negative for dysuria or hematuria. Musculoskeletal: Negative for musculoskeletal complaints Neurological: Negative for headache All other ROS negative  ____________________________________________   PHYSICAL EXAM:  VITAL SIGNS: ED Triage Vitals  Enc Vitals Group  BP 07/05/19 1247 125/90     Pulse Rate 07/05/19 1247 72     Resp 07/05/19 1247 18     Temp 07/05/19 1247 98.5 F (36.9 C)     Temp Source 07/05/19 1247 Oral     SpO2 07/05/19 1247 100 %     Weight 07/05/19 1248 180 lb (81.6 kg)     Height 07/05/19 1248 5' 10"  (1.778 m)     Head Circumference --      Peak Flow --      Pain Score 07/05/19 1248 0     Pain Loc --      Pain Edu?  --      Excl. in [place]? --    Constitutional: Alert and oriented. Well appearing and in no distress. Eyes: Normal exam ENT      Head: Normocephalic and atraumatic.      Mouth/Throat: Mucous membranes are moist. Cardiovascular: Normal rate, regular rhythm.  Respiratory: Normal respiratory effort without tachypnea nor retractions. Breath sounds are clear  Gastrointestinal: Soft and nontender. No distention.   Musculoskeletal: Nontender with normal range of motion in all extremities.  Neurologic:  Normal speech and language. No gross focal neurologic deficits  Skin:  Skin is warm, dry and intact.  Psychiatric: Mood and affect are normal.      RADIOLOGY  IMPRESSION:  1. 2 mm distal right ureteral calculus at the ureterovesical  junction causing mild-to-moderate right hydronephrosis and  hydroureter.  2. Diffuse hepatic steatosis.  3. Small hiatal hernia.  ____________________________________________   INITIAL IMPRESSION / ASSESSMENT AND PLAN / ED COURSE  Pertinent labs & imaging results that were available during my care of the patient were reviewed by me and considered in my medical decision making (see chart for details).   Patient presents to the emergency department for bilateral flank pain pain radiating to his testicles, urgency urinating.  Differential would include UTI, pyelonephritis, ureterolithiasis, given x-ray results possible ileus versus SBO.  Patient's labs show a moderate leukocytosis, labs are otherwise largely within normal limits.  CT scan shows a 2 mm distal right ureteral stone at the UVJ which is likely the cause of the patient's pain.  No sign of SBO or ileus.  We will send a urine culture, patient states his pain is mild at this time we will discharge with Percocet Zofran and Flomax.  We will have the patient follow-up with urology.  I discussed return precautions for worsening pain or fever.  Justin Fernandez was evaluated in Emergency Department on 07/05/2019 for the  symptoms described in the history of present illness. He was evaluated in the context of the global COVID-19 pandemic, which necessitated consideration that the patient might be at risk for infection with the SARS-CoV-2 virus that causes COVID-19. Institutional protocols and algorithms that pertain to the evaluation of patients at risk for COVID-19 are in a state of rapid change based on information released by regulatory bodies including the CDC and federal and state organizations. These policies and algorithms were followed during the patient's care in the ED.  ____________________________________________   FINAL CLINICAL IMPRESSION(S) / ED DIAGNOSES  Ureteral stone   Harvest Dark, MD 07/05/19 1544

## 2019-07-06 LAB — URINE CULTURE: Culture: NO GROWTH

## 2021-02-10 ENCOUNTER — Other Ambulatory Visit: Payer: Self-pay | Admitting: Family Medicine

## 2021-02-10 DIAGNOSIS — E041 Nontoxic single thyroid nodule: Secondary | ICD-10-CM

## 2021-03-17 ENCOUNTER — Encounter: Payer: Self-pay | Admitting: Urology

## 2021-03-17 ENCOUNTER — Other Ambulatory Visit: Payer: Self-pay

## 2021-03-17 ENCOUNTER — Ambulatory Visit (INDEPENDENT_AMBULATORY_CARE_PROVIDER_SITE_OTHER): Payer: 59 | Admitting: Urology

## 2021-03-17 VITALS — BP 126/84 | HR 85 | Ht 70.0 in | Wt 172.5 lb

## 2021-03-17 DIAGNOSIS — R972 Elevated prostate specific antigen [PSA]: Secondary | ICD-10-CM | POA: Diagnosis not present

## 2021-03-17 DIAGNOSIS — N401 Enlarged prostate with lower urinary tract symptoms: Secondary | ICD-10-CM

## 2021-03-17 MED ORDER — TAMSULOSIN HCL 0.4 MG PO CAPS: 0.4000 mg | ORAL_CAPSULE | Freq: Every day | ORAL | 0 refills | Status: DC

## 2021-03-17 NOTE — Progress Notes (Signed)
03/17/2021 10:04 PM   [name] December 04, 1960 [id number]  Referring provider: Maryland Pink, MD [address] [facility] [place],  [place] [address]  Chief Complaint  Patient presents with  . Elevated PSA    HPI: Justin Fernandez is a 60 y.o. referred for evaluation of an elevated PSA.   PSA 02/10/2021 was 4.31  Prior PSA results:     No prior history of urologic problems  For several years he has complained of weak urinary stream, urgency with postvoid dribbling  Tamsulosin is listed in his med list however he states he has not taken this medication  No family history prostate cancer  No history UTI, dysuria or gross hematuria  No flank, abdominal or pelvic pain   PMH: Past Medical History:  Diagnosis Date  . Crohn disease [facility])     Surgical History: Past Surgical History:  Procedure Laterality Date  . bowel resection    . LEFT HEART CATH AND CORONARY ANGIOGRAPHY Right 06/29/2018   Procedure: LEFT HEART CATH AND CORONARY ANGIOGRAPHY possible PCI;  Surgeon: Minna Merritts, MD;  Location: [place] [facility];  Service: Cardiovascular;  Laterality: Right;    Home Medications:  Allergies as of 03/17/2021   No Known Allergies     Medication List       Accurate as of Mar 17, 2021 11:59 PM. If you have any questions, ask your nurse or doctor.        STOP taking these medications   ondansetron 4 MG disintegrating tablet Commonly known as: Zofran ODT Stopped by: Abbie Sons, MD   oxyCODONE-acetaminophen 5-325 MG tablet Commonly known as: Percocet Stopped by: Abbie Sons, MD     TAKE these medications   ALPRAZolam 0.5 MG tablet Commonly known as: XANAX Take 0.5 mg by mouth 3 (three) times daily.   cyanocobalamin 1000 MCG/ML injection Commonly known as: (VITAMIN B-12) Inject 1,000 mcg into the skin every 30 (thirty) days.   levothyroxine 75 MCG tablet Commonly known as: SYNTHROID Take by mouth.   lisinopril 5 MG  tablet Commonly known as: ZESTRIL Take 1 tablet (5 mg total) by mouth every other day.   loperamide 2 MG capsule Commonly known as: IMODIUM Take 2-4 mg by mouth daily.   Multi-Vitamins Tabs Take 1 tablet by mouth daily.   tamsulosin 0.4 MG Caps capsule Commonly known as: FLOMAX Take 1 capsule (0.4 mg total) by mouth daily.   vitamin C 500 MG tablet Commonly known as: ASCORBIC ACID Take 500 mg by mouth daily.       Allergies: No Known Allergies  Family History: Family History  Problem Relation Age of Onset  . Breast cancer Mother   . Colon cancer Father   . Stroke Maternal Grandmother   . Stroke Maternal Grandfather     Social History:  reports that he has quit smoking. He has quit using smokeless tobacco. He reports that he does not drink alcohol and does not use drugs.   Physical Exam: BP 126/84   Pulse 85   Ht 5' 10"  (1.778 m)   Wt 172 lb 8 oz (78.2 kg)   BMI 24.75 kg/m   Constitutional:  Alert and oriented, No acute distress. HEENT: [place] AT, moist mucus membranes.  Trachea midline, no masses. Cardiovascular: No clubbing, cyanosis, or edema. Respiratory: Normal respiratory effort, no increased work of breathing. GI: Abdomen is soft, nontender, nondistended, no abdominal masses GU: Prostate 45 g, smooth without nodules Lymph: No cervical or inguinal  lymphadenopathy. Skin: No rashes, bruises or suspicious lesions. Neurologic: Grossly intact, no focal deficits, moving all 4 extremities. Psychiatric: Normal mood and affect.   Assessment & Plan:    1.  Elevated PSA  Recent PSA 4.31  Although prior PSA results were within normal range by strict criteria they are elevated and based on age-specific guidelines (>2.6)  Benign DRE  Although PSA is a prostate cancer screening test he was informed that cancer is not the most common cause of an elevated PSA. Other potential causes including BPH and inflammation were discussed. He was informed that the only way to  adequately diagnose prostate cancer would be a transrectal ultrasound and biopsy of the prostate. The procedure was discussed including potential risks of bleeding and infection/sepsis. He was also informed that a negative biopsy does not conclusively rule out the possibility that prostate cancer may be present and that continued monitoring is required. The use of newer adjunctive blood tests including PHI and 4kScore were discussed. The use of multiparametric prostate MRI was also discussed however is not typically used for initial evaluation of an elevated PSA. Continued periodic surveillance was also discussed.  Follow-up PSA ~ 1 month after 1 month of tamsulosin (see below)  2.  BPH with LUTS  Trial tamsulosin 0.4 mg daily   Abbie Sons, MD  [facility] [address], [place], [place] [address] [phone]

## 2021-03-22 ENCOUNTER — Other Ambulatory Visit: Payer: Self-pay

## 2021-03-22 ENCOUNTER — Ambulatory Visit
Admission: RE | Admit: 2021-03-22 | Discharge: 2021-03-22 | Disposition: A | Discharge status: Home / Self Care | Payer: 59 | Source: Ambulatory Visit | Attending: Family Medicine | Admitting: Family Medicine

## 2021-03-22 DIAGNOSIS — E041 Nontoxic single thyroid nodule: Secondary | ICD-10-CM | POA: Diagnosis not present

## 2021-04-05 ENCOUNTER — Other Ambulatory Visit: Payer: Self-pay | Admitting: *Deleted

## 2021-04-05 DIAGNOSIS — R972 Elevated prostate specific antigen [PSA]: Secondary | ICD-10-CM

## 2021-04-18 ENCOUNTER — Other Ambulatory Visit: Payer: Self-pay | Admitting: Urology

## 2021-04-20 ENCOUNTER — Other Ambulatory Visit: Payer: 59

## 2021-04-22 ENCOUNTER — Other Ambulatory Visit: Payer: 59

## 2021-04-22 ENCOUNTER — Other Ambulatory Visit: Payer: Self-pay

## 2021-04-22 DIAGNOSIS — R972 Elevated prostate specific antigen [PSA]: Secondary | ICD-10-CM

## 2021-04-23 LAB — PSA: Prostate Specific Ag, Serum: 4 ng/mL (ref 0.0–4.0)

## 2021-04-26 ENCOUNTER — Telehealth: Payer: Self-pay | Admitting: *Deleted

## 2021-04-26 MED ORDER — TAMSULOSIN HCL 0.4 MG PO CAPS: 0.4000 mg | ORAL_CAPSULE | Freq: Every day | ORAL | 11 refills | Status: DC

## 2021-04-26 NOTE — Telephone Encounter (Signed)
Notified patient as instructed, patient pleased. Discussed follow-up appointments, patient agrees  

## 2021-04-26 NOTE — Telephone Encounter (Signed)
-----   Message from Abbie Sons, MD sent at 04/25/2021  9:07 AM EDT ----- PSA better at 4.0 but still elevated for age.  Recommend 4Kscore

## 2021-04-27 ENCOUNTER — Ambulatory Visit: Payer: 59 | Admitting: *Deleted

## 2021-04-27 ENCOUNTER — Other Ambulatory Visit: Payer: Self-pay

## 2021-04-27 DIAGNOSIS — R972 Elevated prostate specific antigen [PSA]: Secondary | ICD-10-CM

## 2021-04-27 NOTE — Progress Notes (Signed)
4KSCORE  Due to elevated PSA level patient presents today for a 4KSCORE  Blood was drawn from left antecubital fossa   using a 21G needle and syringe to deposit blood into a SST tube  Patient tolerated well, no complications were noted  Blood collected today was spun and labeled for packaging. Requisition form was filled out and signed by provider and patient.  [employer] was contacted for pick up of specimen.   Preformed byRetia Passe CMA     If Patient has medicare make sure they are aware, Medicare is only covering the 4K draw if it meets this criteria:  Elevated PSA  DRE no nodules  Pervious Negative Biopsy

## 2021-04-30 ENCOUNTER — Other Ambulatory Visit: Payer: Self-pay | Admitting: Urology

## 2021-05-03 ENCOUNTER — Telehealth: Payer: Self-pay | Admitting: Urology

## 2021-05-03 NOTE — Telephone Encounter (Signed)
4K score showed an elevated risk of high-grade prostate cancer at 9.1%.  A PSA is part of the test and was 5.29.  Based on these results recommend scheduling prostate biopsy.

## 2021-05-05 NOTE — Telephone Encounter (Signed)
Notified patient as instructed, patient pleased. . Prostate Biopsy Instructions  Stop all aspirin or blood thinners (aspirin, plavix, coumadin, warfarin, motrin, ibuprofen, advil, aleve, naproxen, naprosyn) for 7 days prior to the procedure.  If you have any questions about stopping these medications, please contact your primary care physician or cardiologist.  Having a light meal prior to the procedure is recommended.  If you are diabetic or have low blood sugar please bring a small snack or glucose tablet.  A Fleets enema is needed to be purchased over the counter at a local pharmacy and used 2 hours before you scheduled appointment.  This can be purchased over the counter at any pharmacy.  Antibiotics will be administered in the clinic at the time of the procedure unless otherwise specified.    Please bring someone with you to the procedure to drive you home.  A follow up appointment has been scheduled for you to receive the results of the biopsy.  If you have any questions or concerns, please feel free to call the office at [phone] or send a Mychart message.    Thank you, Staff at [facility]

## 2021-05-27 ENCOUNTER — Encounter: Payer: Self-pay | Admitting: Urology

## 2021-05-27 ENCOUNTER — Other Ambulatory Visit: Payer: Self-pay

## 2021-05-27 ENCOUNTER — Ambulatory Visit (INDEPENDENT_AMBULATORY_CARE_PROVIDER_SITE_OTHER): Payer: 59 | Admitting: Urology

## 2021-05-27 VITALS — BP 111/72 | HR 76 | Ht 72.0 in | Wt 172.0 lb

## 2021-05-27 DIAGNOSIS — R972 Elevated prostate specific antigen [PSA]: Secondary | ICD-10-CM | POA: Diagnosis not present

## 2021-05-27 MED ORDER — LEVOFLOXACIN 500 MG PO TABS
500.0000 mg | ORAL_TABLET | Freq: Once | ORAL | Status: AC
  Administered 2021-05-27: 500 mg via ORAL

## 2021-05-27 MED ORDER — GENTAMICIN SULFATE 40 MG/ML IJ SOLN
80.0000 mg | Freq: Once | INTRAMUSCULAR | Status: AC
  Administered 2021-05-27: 80 mg via INTRAMUSCULAR

## 2021-05-27 NOTE — Progress Notes (Signed)
Prostate Biopsy Procedure   Informed consent was obtained after discussing risks/benefits of the procedure.  A time out was performed to ensure correct patient identity.  Pre-Procedure: - Last PSA Level: 04/30/2021 PSA 5.29 4K elevated risk 9.1 - Gentamicin given prophylactically - Levaquin 500 mg administered PO -Transrectal Ultrasound performed revealing a 52 gm prostate -No significant hypoechoic or median lobe noted  Procedure: - Prostate block performed using 10 cc 1% lidocaine and biopsies taken from sextant areas, a total of 12 under ultrasound guidance.  Post-Procedure: - Patient tolerated the procedure well - He was counseled to seek immediate medical attention if experiences any severe pain, significant bleeding, or fevers - Return in one week to discuss biopsy results    John Giovanni, MD

## 2021-05-27 NOTE — Addendum Note (Signed)
Addended by: Kyra Manges on: 05/27/2021 09:50 AM   Modules accepted: Orders

## 2021-05-31 LAB — SURGICAL PATHOLOGY

## 2021-06-01 ENCOUNTER — Encounter: Payer: Self-pay | Admitting: Urology

## 2021-06-03 ENCOUNTER — Ambulatory Visit: Payer: 59 | Admitting: Urology

## 2021-06-18 ENCOUNTER — Other Ambulatory Visit: Payer: Self-pay | Admitting: Urology

## 2021-08-09 ENCOUNTER — Other Ambulatory Visit: Payer: Self-pay | Admitting: Urology

## 2021-11-30 ENCOUNTER — Other Ambulatory Visit: Payer: Self-pay

## 2021-11-30 ENCOUNTER — Other Ambulatory Visit: Payer: 59

## 2021-11-30 DIAGNOSIS — R972 Elevated prostate specific antigen [PSA]: Secondary | ICD-10-CM

## 2021-12-01 LAB — PSA: Prostate Specific Ag, Serum: 3.9 ng/mL (ref 0.0–4.0)

## 2021-12-02 ENCOUNTER — Other Ambulatory Visit: Payer: 59

## 2021-12-03 ENCOUNTER — Other Ambulatory Visit: Payer: Self-pay

## 2021-12-03 ENCOUNTER — Ambulatory Visit (INDEPENDENT_AMBULATORY_CARE_PROVIDER_SITE_OTHER): Payer: 59 | Admitting: Urology

## 2021-12-03 ENCOUNTER — Encounter: Payer: Self-pay | Admitting: Urology

## 2021-12-03 VITALS — BP 138/89 | HR 76 | Ht 70.0 in | Wt 165.0 lb

## 2021-12-03 DIAGNOSIS — R972 Elevated prostate specific antigen [PSA]: Secondary | ICD-10-CM

## 2021-12-03 NOTE — Progress Notes (Signed)
° °  12/03/2021 8:02 AM   Justin Fernandez [birth year] [id number]  Referring provider: Maryland Pink, MD [address] [facility] [place],  [place] [address]  Chief Complaint  Patient presents with   Elevated PSA    Urologic history: 1.  Elevated PSA Prostate biopsy 05/27/2021 for a PSA 5.29 and 4K elevated at 9.1 Prostate volume 52 g Past 12/12 cores benign prostate tissue   HPI: 61 y.o. male presents for 58-monthfollow-up.  Doing well since last visit No bothersome LUTS Denies dysuria, gross hematuria Denies flank, abdominal or pelvic pain PSA 11/30/2021 3.9   PMH: Past Medical History:  Diagnosis Date   Crohn disease ([place]     Surgical History: Past Surgical History:  Procedure Laterality Date   bowel resection     LEFT HEART CATH AND CORONARY ANGIOGRAPHY Right 06/29/2018   Procedure: LEFT HEART CATH AND CORONARY ANGIOGRAPHY possible PCI;  Surgeon: GMinna Merritts MD;  [facility];  Service: Cardiovascular;  Laterality: Right;    Home Medications:  Allergies as of 12/03/2021   No Known Allergies      Medication List        Accurate as of December 03, 2021  8:02 AM. If you have any questions, ask your nurse or doctor.          ALPRAZolam 0.5 MG tablet Commonly known as: XANAX Take 0.5 mg by mouth 3 (three) times daily.   cyanocobalamin 1000 MCG/ML injection Commonly known as: (VITAMIN B-12) Inject 1,000 mcg into the skin every 30 (thirty) days.   levothyroxine 75 MCG tablet Commonly known as: SYNTHROID Take by mouth.   loperamide 2 MG capsule Commonly known as: IMODIUM Take 2-4 mg by mouth daily.   Multi-Vitamins Tabs Take 1 tablet by mouth daily.   tamsulosin 0.4 MG Caps capsule Commonly known as: FLOMAX TAKE 1 CAPSULE BY MOUTH EVERY DAY        Allergies: No Known Allergies  Family History: Family History  Problem Relation Age of Onset   Breast cancer Mother    Colon cancer Father    Stroke Maternal  Grandmother    Stroke Maternal Grandfather     Social History:  reports that he has quit smoking. He has quit using smokeless tobacco. He reports that he does not drink alcohol and does not use drugs.   Physical Exam: BP 138/89    Pulse 76    Ht 5' 10"  (1.778 m)    Wt 165 lb (74.8 kg)    BMI 23.68 kg/m   Constitutional:  Alert and oriented, No acute distress. HEENT: [place] AT, moist mucus membranes.  Trachea midline, no masses. Cardiovascular: No clubbing, cyanosis, or edema. Respiratory: Normal respiratory effort, no increased work of breathing. Psychiatric: Normal mood and affect.   Assessment & Plan:    1.  History elevated PSA Benign prostate biopsy PSA 5.29 PCP office checked a PSA in September that was 7.7 however this was relatively soon after his prostate biopsy.  I have requested that Dr. HBarbarann Ehlersoffice let uKoreacheck his PSA.  He only needs it checked twice yearly and there can be insurance issues we will check too often Lab visit 6 months for PSA and 161-monthollow-up for PSA/DRE   ScAbbie SonsMD  [place] [phone]

## 2021-12-03 NOTE — Patient Instructions (Signed)
Please ask Dr. Kary Kos not to check PSA.

## 2021-12-06 ENCOUNTER — Ambulatory Visit: Payer: 59 | Admitting: Urology

## 2022-02-01 IMAGING — US US THYROID
1 series · 14 of 25 positions shown · non-contrast
Comparison: None.

CLINICAL DATA: Palpable abnormality. Palpable right-sided thyroid
nodule.

EXAM:
THYROID ULTRASOUND
TECHNIQUE: Ultrasound examination of the thyroid gland and adjacent soft
tissues was performed.

[Series 1: us thyroid · 0.07mm/px · 14 of 41 slices shown]
[im 1/41]
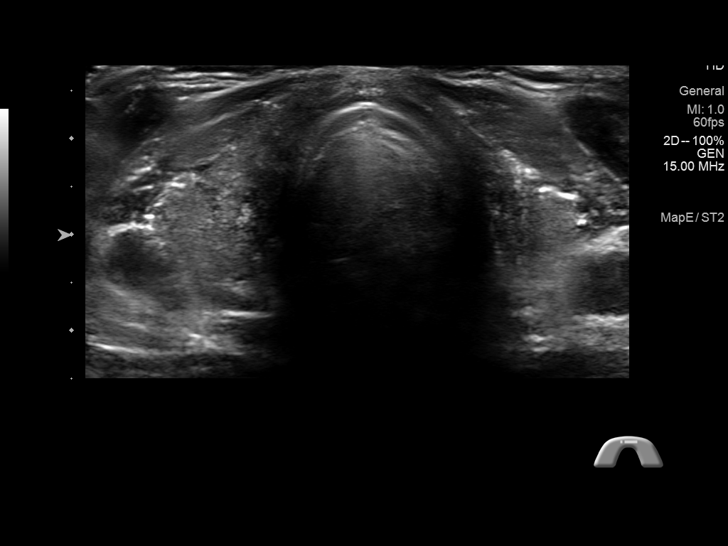
[im 4/41]
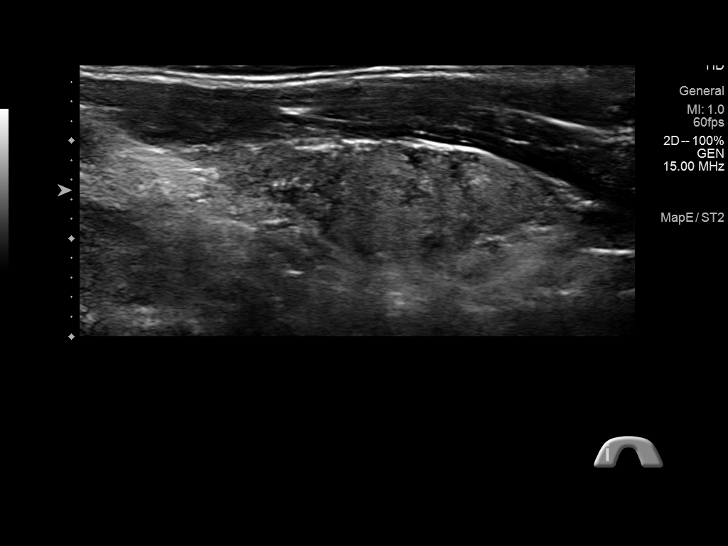
[im 7/41]
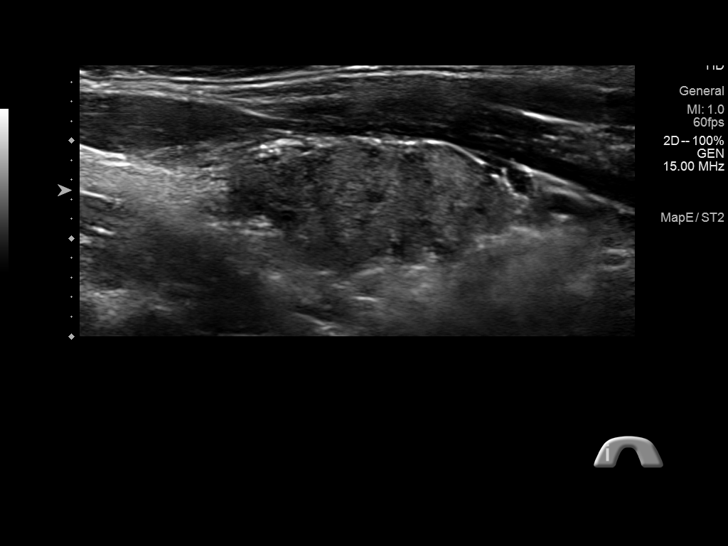
[im 11/41]
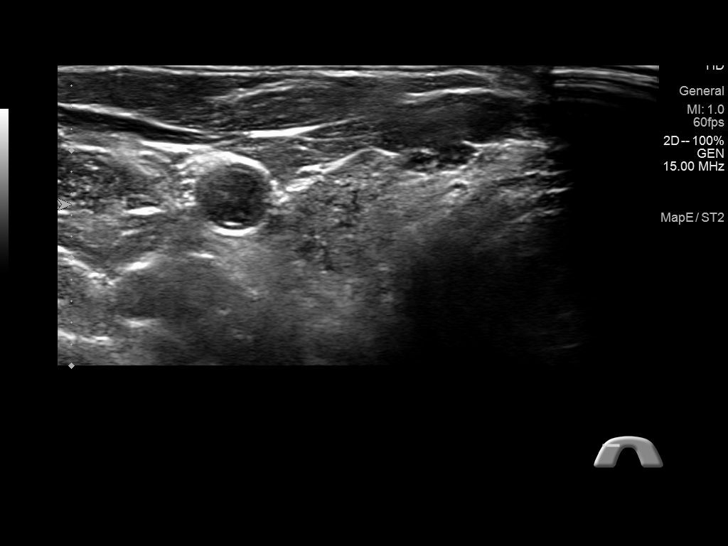
[im 14/41]
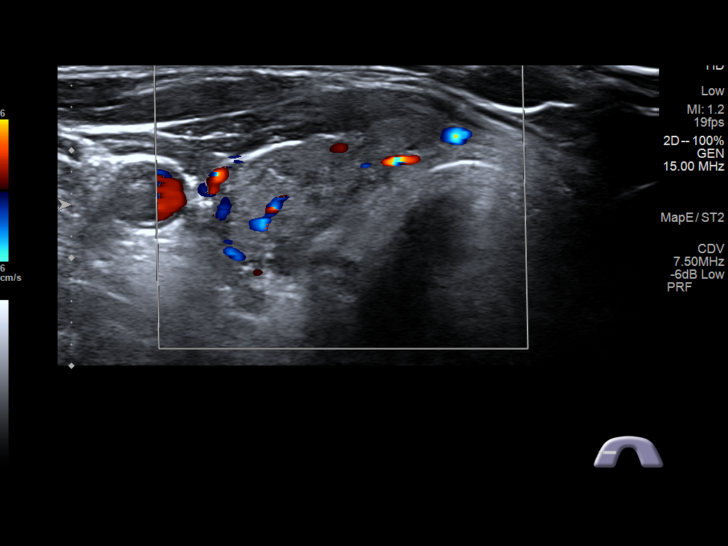
[im 16/41]
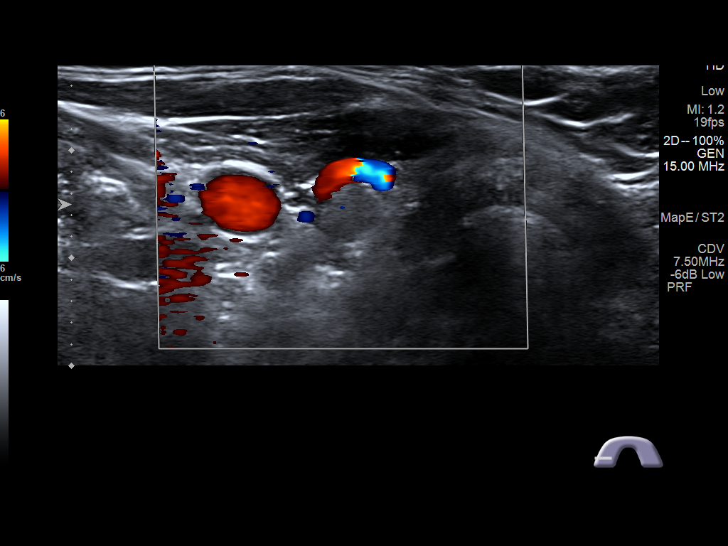
[im 19/41]
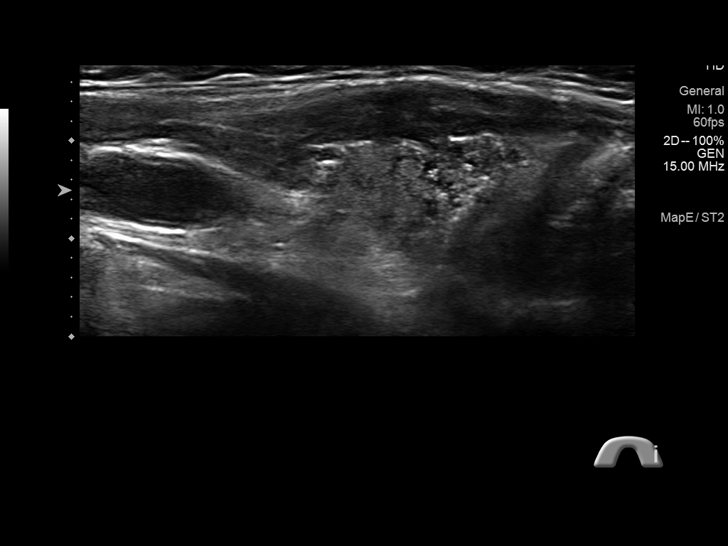
[im 22/41]
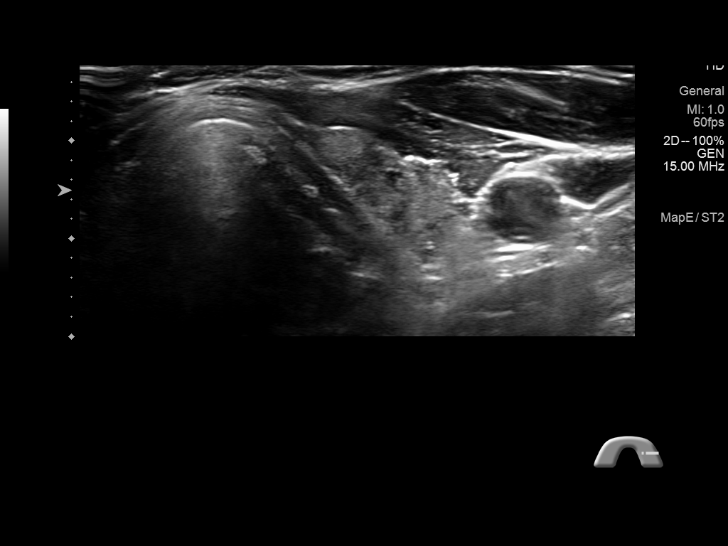
[im 26/41]
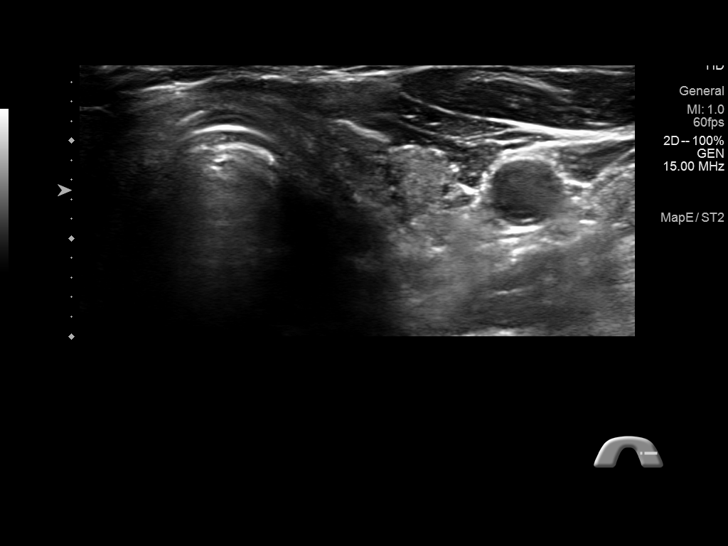
[im 27/41]
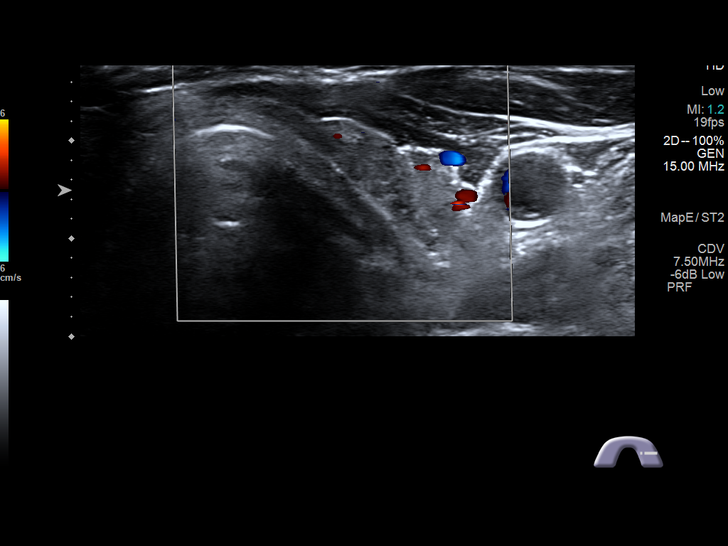
[im 31/41]
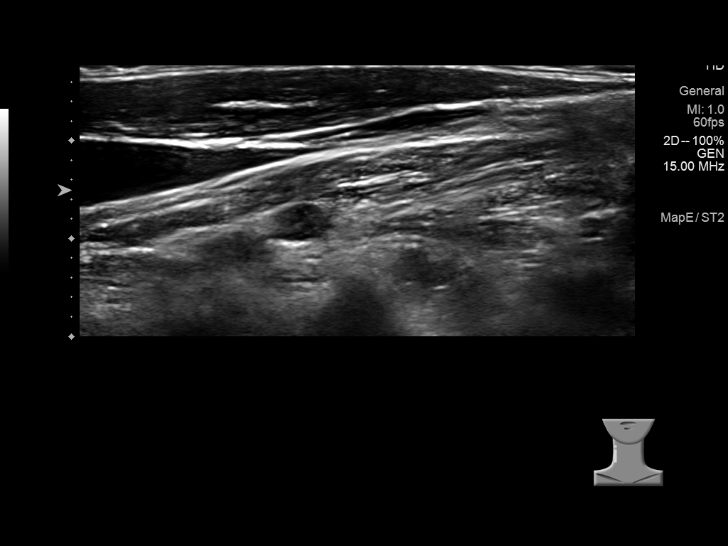
[im 34/41]
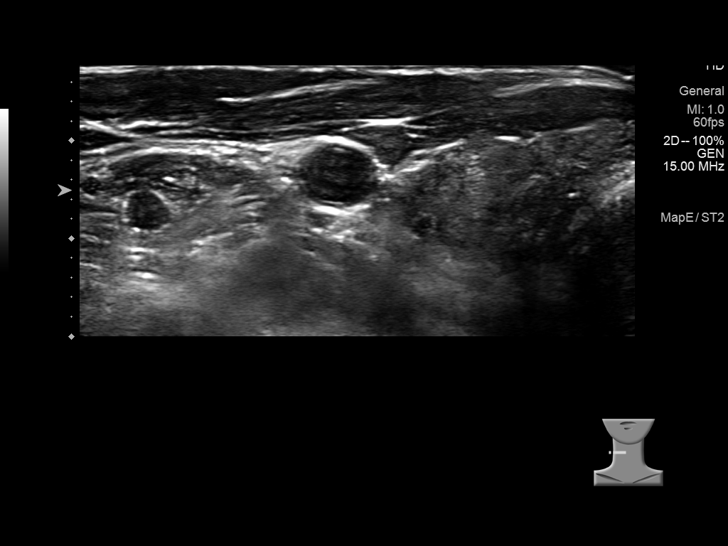
[im 37/41]
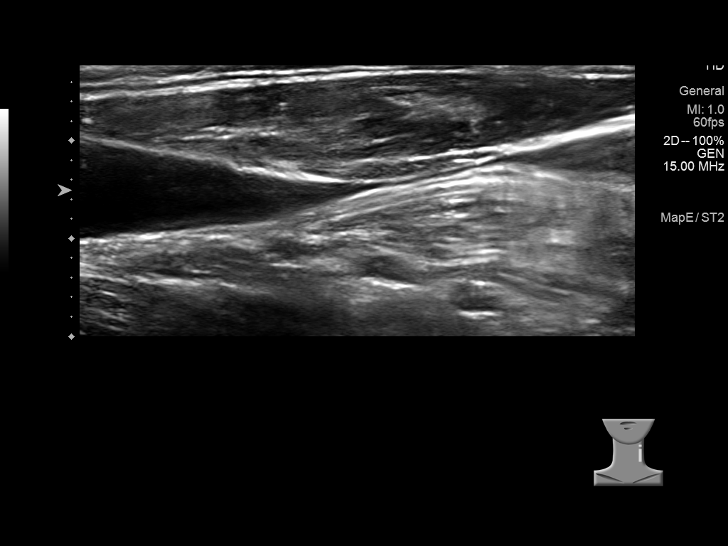
[im 41/41]
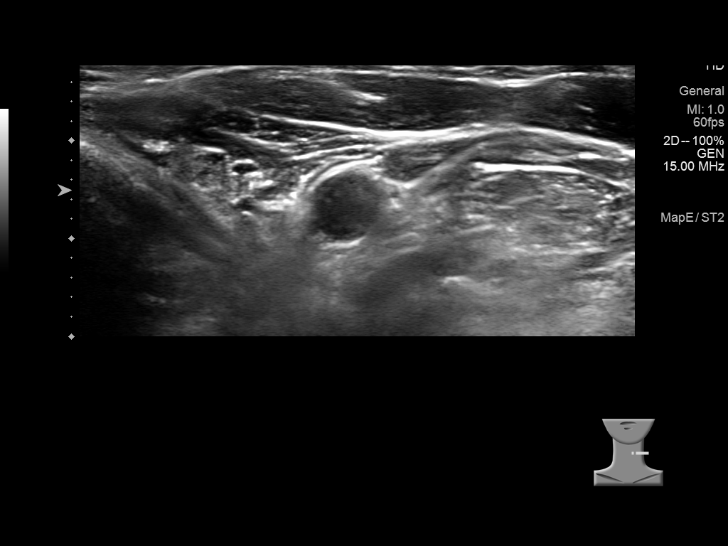

[14 of 25 positions shown; findings below may reference images not displayed]

FINDINGS: Parenchymal Echotexture: Markedly heterogenous - no definitive
evidence of glandular hyperemia.

Isthmus: Atrophic measuring 0.2 cm in diameter

Right lobe: Atrophic measuring 2.8 x 1.2 x 1.3 cm

Left lobe: Atrophic measuring 1.9 x 1.0 x 1.1 cm

_________________________________________________________

Estimated total number of nodules >/= 1 cm: 0

Number of spongiform nodules >/=  2 cm not described below (TR1): 0

Number of mixed cystic and solid nodules >/= 1.5 cm not described
below (TR2): 0

_________________________________________________________

No discrete nodules are seen within the thyroid gland.
IMPRESSION: Atrophic and markedly heterogeneous appearing thyroid without
discrete nodule or mass. Findings are nonspecific though could be
seen in the setting of a chronic thyroiditis. Clinical correlation
is advised.

## 2022-06-02 ENCOUNTER — Other Ambulatory Visit: Payer: 59

## 2022-06-02 DIAGNOSIS — R972 Elevated prostate specific antigen [PSA]: Secondary | ICD-10-CM

## 2022-06-03 ENCOUNTER — Encounter: Payer: Self-pay | Admitting: Urology

## 2022-06-03 LAB — PSA: Prostate Specific Ag, Serum: 4.1 ng/mL — ABNORMAL HIGH (ref 0.0–4.0)

## 2022-06-23 ENCOUNTER — Other Ambulatory Visit: Payer: Self-pay | Admitting: Urology

## 2022-08-27 ENCOUNTER — Other Ambulatory Visit: Payer: Self-pay | Admitting: Urology

## 2022-12-02 ENCOUNTER — Other Ambulatory Visit: Payer: Self-pay

## 2022-12-02 DIAGNOSIS — R972 Elevated prostate specific antigen [PSA]: Secondary | ICD-10-CM

## 2022-12-02 DIAGNOSIS — N401 Enlarged prostate with lower urinary tract symptoms: Secondary | ICD-10-CM

## 2022-12-05 ENCOUNTER — Other Ambulatory Visit: Payer: 59

## 2022-12-05 DIAGNOSIS — N401 Enlarged prostate with lower urinary tract symptoms: Secondary | ICD-10-CM

## 2022-12-05 DIAGNOSIS — R972 Elevated prostate specific antigen [PSA]: Secondary | ICD-10-CM

## 2022-12-06 LAB — PSA: Prostate Specific Ag, Serum: 4.4 ng/mL — ABNORMAL HIGH (ref 0.0–4.0)

## 2022-12-09 ENCOUNTER — Ambulatory Visit (INDEPENDENT_AMBULATORY_CARE_PROVIDER_SITE_OTHER): Payer: 59 | Admitting: Urology

## 2022-12-09 ENCOUNTER — Encounter: Payer: Self-pay | Admitting: Urology

## 2022-12-09 VITALS — BP 150/97 | HR 75 | Ht 70.0 in | Wt 173.0 lb

## 2022-12-09 DIAGNOSIS — R972 Elevated prostate specific antigen [PSA]: Secondary | ICD-10-CM

## 2022-12-09 NOTE — Progress Notes (Signed)
   12/09/2022 8:23 AM   [name] [birth year] [id number]  Referring provider: Maryland Pink, MD [address] [facility] [place],  [place] [address]  Chief Complaint  Patient presents with   Elevated PSA    Urologic history: 1.  Elevated PSA Prostate biopsy 05/27/2021 for a PSA 5.29 and 4K elevated at 9.1 Prostate volume 52 g Past 12/12 cores benign prostate tissue   HPI: 62 y.o. male presents for annual follow-up.  Doing well since last visit No bothersome LUTS Denies dysuria, gross hematuria Denies flank, abdominal or pelvic pain PSA 12/05/2022 was 4.4; PSA August 2024 was 4.1   PMH: Past Medical History:  Diagnosis Date   Crohn disease ([place])     Surgical History: Past Surgical History:  Procedure Laterality Date   bowel resection     LEFT HEART CATH AND CORONARY ANGIOGRAPHY Right 06/29/2018   Procedure: LEFT HEART CATH AND CORONARY ANGIOGRAPHY possible PCI;  Surgeon: Minna Merritts, MD;  Location: [facility];  Service: Cardiovascular;  Laterality: Right;    Home Medications:  Allergies as of 12/09/2022   No Known Allergies      Medication List        Accurate as of December 09, 2022  8:23 AM. If you have any questions, ask your nurse or doctor.          STOP taking these medications    Multi-Vitamins Tabs Stopped by: Abbie Sons, MD       TAKE these medications    ALPRAZolam 0.5 MG tablet Commonly known as: XANAX Take 0.5 mg by mouth 3 (three) times daily.   cyanocobalamin 1000 MCG/ML injection Commonly known as: VITAMIN B12 Inject 1,000 mcg into the skin every 30 (thirty) days.   levothyroxine 75 MCG tablet Commonly known as: SYNTHROID Take by mouth.   loperamide 2 MG capsule Commonly known as: IMODIUM Take 2-4 mg by mouth daily.   tamsulosin 0.4 MG Caps capsule Commonly known as: FLOMAX TAKE 1 CAPSULE BY MOUTH EVERY DAY        Allergies: No Known Allergies  Family History: Family History   Problem Relation Age of Onset   Breast cancer Mother    Colon cancer Father    Stroke Maternal Grandmother    Stroke Maternal Grandfather     Social History:  reports that he has quit smoking. He has quit using smokeless tobacco. He reports that he does not drink alcohol and does not use drugs.   Physical Exam: BP (!) 150/97   Pulse 75   Ht 5\' 10"  (1.778 m)   Wt 173 lb (78.5 kg)   BMI 24.82 kg/m   Constitutional:  Alert and oriented, No acute distress. HEENT: [place] AT, moist mucus membranes.  Trachea midline, no masses. Cardiovascular: No clubbing, cyanosis, or edema. Respiratory: Normal respiratory effort, no increased work of breathing. Psychiatric: Normal mood and affect.   Assessment & Plan:    1.  Elevated PSA Previous biopsy PSA 5.29.  Last 2 PSA slightly elevated but within baseline Lab visit 6 months for PSA and office visit 12 months PSA/DRE   Abbie Sons, MD  [place] [address] [place], [place] [address] [phone]

## 2023-06-08 ENCOUNTER — Other Ambulatory Visit: Payer: 59

## 2023-06-09 ENCOUNTER — Other Ambulatory Visit: Payer: 59

## 2023-06-30 ENCOUNTER — Other Ambulatory Visit: Payer: 59

## 2023-06-30 DIAGNOSIS — R972 Elevated prostate specific antigen [PSA]: Secondary | ICD-10-CM

## 2023-07-01 LAB — PSA: Prostate Specific Ag, Serum: 3.7 ng/mL (ref 0.0–4.0)

## 2023-09-16 ENCOUNTER — Other Ambulatory Visit: Payer: Self-pay | Admitting: Urology

## 2023-11-24 ENCOUNTER — Other Ambulatory Visit: Payer: Self-pay | Admitting: *Deleted

## 2023-11-24 DIAGNOSIS — R972 Elevated prostate specific antigen [PSA]: Secondary | ICD-10-CM

## 2023-12-08 ENCOUNTER — Other Ambulatory Visit: Payer: BC Managed Care – PPO

## 2023-12-08 ENCOUNTER — Other Ambulatory Visit: Payer: 59

## 2023-12-08 DIAGNOSIS — R972 Elevated prostate specific antigen [PSA]: Secondary | ICD-10-CM

## 2023-12-09 LAB — PSA: Prostate Specific Ag, Serum: 3.8 ng/mL (ref 0.0–4.0)

## 2023-12-11 ENCOUNTER — Ambulatory Visit: Payer: 59 | Admitting: Urology

## 2023-12-13 ENCOUNTER — Encounter: Payer: Self-pay | Admitting: Urology

## 2023-12-13 ENCOUNTER — Ambulatory Visit: Payer: BC Managed Care – PPO | Admitting: Urology

## 2023-12-13 VITALS — BP 146/97 | HR 101 | Ht 70.0 in | Wt 180.0 lb

## 2023-12-13 DIAGNOSIS — Z87898 Personal history of other specified conditions: Secondary | ICD-10-CM

## 2023-12-13 DIAGNOSIS — N401 Enlarged prostate with lower urinary tract symptoms: Secondary | ICD-10-CM | POA: Diagnosis not present

## 2023-12-13 MED ORDER — TAMSULOSIN HCL 0.4 MG PO CAPS: 0.4000 mg | ORAL_CAPSULE | Freq: Every day | ORAL | 4 refills | Status: AC

## 2023-12-13 NOTE — Progress Notes (Signed)
 I, Maysun Anabel Bene, acting as a scribe for Riki Altes, MD., have documented all relevant documentation on the behalf of Riki Altes, MD, as directed by Riki Altes, MD while in the presence of Riki Altes, MD.  12/13/2023 10:30 AM   Justin Fernandez 1961/10/31 [id number]  Referring provider: Jerl Mina, MD [address] [facility] [place],  [place] [address]  Chief Complaint  Patient presents with   Elevated PSA   Urologic history: 1.  Elevated PSA Prostate biopsy 05/27/2021 for a PSA 5.29 and 4K elevated at 9.1 Prostate volume 52 g Past 12/12 cores benign prostate tissue  2. BPH with LUTS Tamsulosin 0.4 mg daily  HPI: Justin Fernandez is a 63 y.o. male presents for annual follow-up.    Doing well since last visit No bothersome LUTS Denies dysuria, gross hematuria Denies flank, abdominal or pelvic pain PSA slightly elevated last years visit and follow-up PSAs August 2024 and February 2025 were 3.7 and 3.8 respectively.  PSA trend   Prostate Specific Ag, Serum  Latest Ref Rng 0.0 - 4.0 ng/mL  04/22/2021 4.0   11/30/2021 3.9   06/02/2022 4.1 (H)   12/05/2022 4.4 (H)   06/30/2023 3.7   12/08/2023 3.8      PMH: Past Medical History:  Diagnosis Date   Crohn disease ([facility])     Surgical History: Past Surgical History:  Procedure Laterality Date   bowel resection     LEFT HEART CATH AND CORONARY ANGIOGRAPHY Right 06/29/2018   Procedure: LEFT HEART CATH AND CORONARY ANGIOGRAPHY possible PCI;  Surgeon: Antonieta Iba, MD;  Location: [facility];  Service: Cardiovascular;  Laterality: Right;    Home Medications:  Allergies as of 12/13/2023   No Known Allergies      Medication List        Accurate as of December 13, 2023 10:30 AM. If you have any questions, ask your nurse or doctor.          ALPRAZolam 0.5 MG tablet Commonly known as: XANAX Take 0.5 mg by mouth 3 (three) times daily.   cyanocobalamin 1000 MCG/ML  injection Commonly known as: VITAMIN B12 Inject 1,000 mcg into the skin every 30 (thirty) days.   levothyroxine 75 MCG tablet Commonly known as: SYNTHROID Take by mouth.   loperamide 2 MG capsule Commonly known as: IMODIUM Take 2-4 mg by mouth daily.   tamsulosin 0.4 MG Caps capsule Commonly known as: FLOMAX Take 1 capsule (0.4 mg total) by mouth daily.        Allergies: No Known Allergies  Family History: Family History  Problem Relation Age of Onset   Breast cancer Mother    Colon cancer Father    Stroke Maternal Grandmother    Stroke Maternal Grandfather     Social History:  reports that he has quit smoking. He has quit using smokeless tobacco. He reports that he does not drink alcohol and does not use drugs.   Physical Exam: BP (!) 146/97   Pulse (!) 101   Ht 5\' 10"  (1.778 m)   Wt 180 lb (81.6 kg)   BMI 25.83 kg/m   Constitutional:  Alert and oriented, No acute distress. HEENT: [place] AT Respiratory: Normal respiratory effort, no increased work of breathing. Psychiatric: Normal mood and affect.   Assessment & Plan:    1. History elevated PSA Most recent PSAs x2 were in the normal range.  We discussed prostate cancer screening guidelines recommend PSA  and DRE. We discussed that 95% of prostate cancers are diagnosed via PSA alone. However, 5% of the time, a DRE can be abnormal with a stable PSA. Using shared decision-making, he elected to defer DRE.   2. BPH with LUTS Tamsulosin refilled.  Continue annual follow-up.  [facility] [address] [place], [place] [address] [phone]   I have reviewed the above documentation for accuracy and completeness, and I agree with the above.   Riki Altes, MD

## 2024-03-20 ENCOUNTER — Other Ambulatory Visit
Admission: RE | Admit: 2024-03-20 | Discharge: 2024-03-20 | Disposition: A | Discharge status: Home / Self Care | Source: Ambulatory Visit | Attending: Family Medicine | Admitting: Family Medicine

## 2024-03-20 DIAGNOSIS — R600 Localized edema: Secondary | ICD-10-CM | POA: Diagnosis present

## 2024-03-20 LAB — BRAIN NATRIURETIC PEPTIDE: B Natriuretic Peptide: 36.1 pg/mL (ref 0.0–100.0)

## 2024-06-05 ENCOUNTER — Encounter: Payer: Self-pay | Admitting: Urology

## 2024-12-10 ENCOUNTER — Ambulatory Visit: Admitting: Urology

## 2024-12-12 ENCOUNTER — Ambulatory Visit: Payer: BC Managed Care – PPO | Admitting: Urology
